# Patient Record
Sex: Male | Born: 1945
Health system: Southern US, Community
[De-identification: ages and names within clinical notes are randomized; demographics above are authoritative.]

## PROBLEM LIST (undated history)

## (undated) DIAGNOSIS — N529 Male erectile dysfunction, unspecified: Secondary | ICD-10-CM

## (undated) DIAGNOSIS — Z952 Presence of prosthetic heart valve: Secondary | ICD-10-CM

## (undated) DIAGNOSIS — I219 Acute myocardial infarction, unspecified: Secondary | ICD-10-CM

## (undated) DIAGNOSIS — N400 Enlarged prostate without lower urinary tract symptoms: Secondary | ICD-10-CM

## (undated) DIAGNOSIS — R339 Retention of urine, unspecified: Secondary | ICD-10-CM

## (undated) DIAGNOSIS — Z720 Tobacco use: Secondary | ICD-10-CM

## (undated) DIAGNOSIS — I213 ST elevation (STEMI) myocardial infarction of unspecified site: Secondary | ICD-10-CM

## (undated) DIAGNOSIS — Z789 Other specified health status: Secondary | ICD-10-CM

## (undated) DIAGNOSIS — R972 Elevated prostate specific antigen [PSA]: Secondary | ICD-10-CM

## (undated) DIAGNOSIS — I350 Nonrheumatic aortic (valve) stenosis: Secondary | ICD-10-CM

## (undated) DIAGNOSIS — E785 Hyperlipidemia, unspecified: Secondary | ICD-10-CM

## (undated) DIAGNOSIS — I2510 Atherosclerotic heart disease of native coronary artery without angina pectoris: Secondary | ICD-10-CM

## (undated) HISTORY — DX: Retention of urine, unspecified: R33.9

## (undated) HISTORY — DX: Male erectile dysfunction, unspecified: N52.9

## (undated) HISTORY — PX: TONSILLECTOMY: SUR1361

## (undated) HISTORY — DX: Tobacco use: Z72.0

## (undated) HISTORY — DX: Elevated prostate specific antigen (PSA): R97.20

## (undated) HISTORY — DX: Nonrheumatic aortic (valve) stenosis: I35.0

## (undated) HISTORY — PX: VASECTOMY: SHX75

## (undated) HISTORY — DX: Acute myocardial infarction, unspecified: I21.9

## (undated) HISTORY — PX: ANKLE FRACTURE SURGERY: SHX122

## (undated) HISTORY — PX: OTHER SURGICAL HISTORY: SHX169

---

## 2010-10-16 ENCOUNTER — Encounter: Payer: Self-pay | Admitting: Cardiovascular Disease

## 2015-03-02 ENCOUNTER — Emergency Department
Admission: EM | Admit: 2015-03-02 | Discharge: 2015-03-02 | Disposition: A | Discharge status: Home / Self Care | Payer: Medicare Other | Attending: Student | Admitting: Student

## 2015-03-02 ENCOUNTER — Encounter: Payer: Self-pay | Admitting: Emergency Medicine

## 2015-03-02 DIAGNOSIS — K4041 Unilateral inguinal hernia, with gangrene, recurrent: Secondary | ICD-10-CM | POA: Insufficient documentation

## 2015-03-02 DIAGNOSIS — Z720 Tobacco use: Secondary | ICD-10-CM | POA: Insufficient documentation

## 2015-03-02 DIAGNOSIS — R1030 Lower abdominal pain, unspecified: Secondary | ICD-10-CM | POA: Diagnosis present

## 2015-03-02 HISTORY — DX: Benign prostatic hyperplasia without lower urinary tract symptoms: N40.0

## 2015-03-02 NOTE — Discharge Instructions (Signed)
Hernia °A hernia happens when an organ inside your body pushes out through a weak spot in your belly (abdominal) wall. Most hernias get worse over time. They can often be pushed back into place (reduced). Surgery may be needed to repair hernias that cannot be pushed into place. °HOME CARE °· Keep doing normal activities. °· Avoid lifting more than 10 pounds (4.5 kilograms). °· Cough gently and avoid straining. Over time, these things will: °¨ Increase your hernia size. °¨ Irritate your hernia. °¨ Break down hernia repairs. °· Stop smoking. °· Do not wear anything tight over your hernia. Do not keep the hernia in with an outside bandage. °· Eat food that is high in fiber (fruit, vegetables, whole grains). °· Drink enough fluids to keep your pee (urine) clear or pale yellow. °· Take medicines to make your poop soft (stool softeners) if you cannot poop (constipated). °GET HELP RIGHT AWAY IF:  °· You have a fever. °· You have belly pain that gets worse. °· You feel sick to your stomach (nauseous) and throw up (vomit). °· Your skin starts to bulge out. °· Your hernia turns a different color, feels hard, or is tender. °· You have increased pain or puffiness (swelling) around the hernia. °· You poop more or less often. °· Your poop does not look the way normally does. °· You have watery poop (diarrhea). °· You cannot push the hernia back in place by applying gentle pressure while lying down. °MAKE SURE YOU:  °· Understand these instructions. °· Will watch your condition. °· Will get help right away if you are not doing well or get worse. °Document Released: 04/07/2010 Document Revised: 01/10/2012 Document Reviewed: 04/07/2010 °ExitCare® Patient Information ©2015 [employer]. This information is not intended to replace advice given to you by your health care provider. Make sure you discuss any questions you have with your health care provider. ° °

## 2015-03-02 NOTE — ED Notes (Signed)
Pt to ed for evaluation of groin pain. States " i have BPH and I have been out of my medicine since i moved"

## 2015-03-02 NOTE — ED Notes (Signed)
Pt states that today he started having left groin pain with a small lump noted, pt also states that he was having a burning pain in his penis but states that the pain as well as the lump is starting to go away

## 2015-03-02 NOTE — ED Provider Notes (Signed)
[facility] Emergency Department Provider Note    ____________________________________________  Time seen: 4034 hrs.  I have reviewed the triage vital signs and the nursing notes.   HISTORY  Chief Complaint Groin Pain   This 69 year old Caucasian male complaining of a left inguinal pain.    HPI Michael Edwards is a 69 y.o. male patient complained in the ingrown pain MS which is resolved while waiting in the emergency room location is in the left inguinal area.The onset of the pain and the mass approximately 3 hours ago patient now rates the pain as 0 although initial pain was 5. Patient described the pain as aching. He stated he knows the pain increases with, from a sitting to a standing position. Patient states does not know what made the pain get better but the masses also resolved patient states there is some urinary hesitancy but he contribute  that to his condition of BPH.   Past Medical History  Diagnosis Date  . Enlarged prostate     There are no active problems to display for this patient.   Past Surgical History  Procedure Laterality Date  . Blood clot removed from left upper leg    . Vasectomy      No current outpatient prescriptions on file.  Allergies Review of patient's allergies indicates no known allergies.  Family History  Problem Relation Age of Onset  . Diabetes Sister   . Heart failure Sister     Social History History  Substance Use Topics  . Smoking status: Current Every Day Smoker -- 1.50 packs/day  . Smokeless tobacco: Not on file  . Alcohol Use: Yes     Comment: 6 beers a week    Review of Systems Constitutional: Negative for fever. Eyes: Negative for visual changes. ENT: Negative for sore throat. Cardiovascular: Negative for chest pain. Respiratory: Negative for shortness of breath. Gastrointestinal: Negative for abdominal pain, vomiting and diarrhea. Genitourinary: Negative for dysuria. Positive for  urinary hesitancy Musculoskeletal: Negative for back pain. Skin: Negative for rash. Neurological: Negative for headaches, focal weakness or numbness. Psychiatric:None Endocrine:None Hematological/Lymphatic: Allergic/Immunilogical: **}  10-point ROS otherwise negative.  ____________________________________________   PHYSICAL EXAM:  VITAL SIGNS: ED Triage Vitals  Enc Vitals Group     BP 03/02/15 1553 143/75 mmHg     Pulse Rate 03/02/15 1553 71     Resp --      Temp 03/02/15 1553 98.2 F (36.8 C)     Temp Source 03/02/15 1553 Oral     SpO2 03/02/15 1553 98 %     Weight 03/02/15 1553 145 lb (65.772 kg)     Height 03/02/15 1553 5\' 5"  (1.651 m)     Head Cir --      Peak Flow --      Pain Score 03/02/15 1607 2     Pain Loc --      Pain Edu? --      Excl. in [place]? --      Constitutional: Alert and oriented. Well appearing and in no distress. Eyes: Conjunctivae are normal. PERRL. Normal extraocular movements. ENT   Head: Normocephalic and atraumatic.   Nose: No congestion/rhinnorhea.   Mouth/Throat: Mucous membranes are moist.   Neck: No stridor. Hematological/Lymphatic/Immunilogical: No cervical lymphadenopathy. Cardiovascular: Normal rate, regular rhythm. Normal and symmetric distal pulses are present in all extremities. No murmurs, rubs, or gallops. Respiratory: Normal respiratory effort without tachypnea nor retractions. Breath sounds are clear and equal bilaterally. No wheezes/rales/rhonchi. Gastrointestinal: Soft and nontender.  No distention. No abdominal bruits. There is no CVA tenderness. Genitourinary: No palpable left inguinal mass. Patient. Prostate exam. Musculoskeletal: Nontender with normal range of motion in all extremities. No joint effusions.  No lower extremity tenderness nor edema. Neurologic:  Normal speech and language. No gross focal neurologic deficits are appreciated. Speech is normal. No gait instability. Skin:  Skin is warm, dry and  intact. No rash noted. Psychiatric: Mood and affect are normal. Speech and behavior are normal. Patient exhibits appropriate insight and judgment.  ____________________________________________   EKG    ____________________________________________    RADIOLOGY    ____________________________________________   PROCEDURES  Procedure(s) performed: None  Critical Care performed: No  ____________________________________________   INITIAL IMPRESSION / ASSESSMENT AND PLAN / ED COURSE  Pertinent labs & imaging results that were available during my care of the patient were reviewed by me and considered in my medical decision making (see chart for details).    ____________________________________________   FINAL CLINICAL IMPRESSION(S) / ED DIAGNOSES  Final diagnoses:  Unilateral recurrent inguinal hernia with gangrene    Sable Feil, PA-C 03/02/15 [place], PA-C 03/02/15 1719  Joanne Gavel, MD 03/03/15 [phone]

## 2015-07-14 LAB — PSA: PSA: 10.6

## 2015-08-19 ENCOUNTER — Encounter: Payer: Self-pay | Admitting: *Deleted

## 2015-08-19 DIAGNOSIS — I219 Acute myocardial infarction, unspecified: Secondary | ICD-10-CM

## 2015-08-19 DIAGNOSIS — I350 Nonrheumatic aortic (valve) stenosis: Secondary | ICD-10-CM

## 2015-08-19 DIAGNOSIS — R011 Cardiac murmur, unspecified: Secondary | ICD-10-CM

## 2015-08-19 HISTORY — DX: Acute myocardial infarction, unspecified: I21.9

## 2015-08-19 HISTORY — DX: Cardiac murmur, unspecified: R01.1

## 2015-08-19 HISTORY — DX: Nonrheumatic aortic (valve) stenosis: I35.0

## 2015-08-20 ENCOUNTER — Ambulatory Visit: Payer: Self-pay

## 2015-08-20 ENCOUNTER — Ambulatory Visit (INDEPENDENT_AMBULATORY_CARE_PROVIDER_SITE_OTHER): Payer: Medicare Other | Admitting: Obstetrics and Gynecology

## 2015-08-20 ENCOUNTER — Encounter: Payer: Self-pay | Admitting: Obstetrics and Gynecology

## 2015-08-20 VITALS — BP 118/72 | HR 64 | Resp 16 | Ht 65.0 in | Wt 145.0 lb

## 2015-08-20 DIAGNOSIS — N400 Enlarged prostate without lower urinary tract symptoms: Secondary | ICD-10-CM | POA: Diagnosis not present

## 2015-08-20 DIAGNOSIS — N509 Disorder of male genital organs, unspecified: Secondary | ICD-10-CM | POA: Diagnosis not present

## 2015-08-20 DIAGNOSIS — R972 Elevated prostate specific antigen [PSA]: Secondary | ICD-10-CM | POA: Diagnosis not present

## 2015-08-20 DIAGNOSIS — N5089 Other specified disorders of the male genital organs: Secondary | ICD-10-CM

## 2015-08-20 LAB — BLADDER SCAN AMB NON-IMAGING

## 2015-08-20 NOTE — Patient Instructions (Signed)
Transrectal Ultrasound-Guided Biopsy A transrectal ultrasound-guided biopsy is a procedure to remove samples of tissue from your prostate using ultrasound images to guide the procedure. The procedure is usually done to evaluate the prostate gland of men who have an elevated prostate-specific antigen (PSA). PSA is a blood test to screen for prostate cancer. The biopsy samples are taken to check for prostate cancer.  LET [facility] CARE PROVIDER KNOW ABOUT:  Any allergies you have.  All medicines you are taking, including vitamins, herbs, eye drops, creams, and over-the-counter medicines.  Previous problems you or members of your family have had with the use of anesthetics.  Any blood disorders you have.  Previous surgeries you have had.  Medical conditions you have. RISKS AND COMPLICATIONS Generally, this is a safe procedure. However, as with any procedure, problems can occur. Possible problems include:  Infection of your prostate.  Bleeding from your rectum or blood in your urine.  Difficulty urinating.  Nerve damage (this is usually temporary).  Damage to surrounding structures such as blood vessels, organs, and muscles, which would require other procedures. BEFORE THE PROCEDURE  Do not eat or drink anything after midnight on the night before the procedure or as directed by your health care provider.  Take medicines only as directed by your health care provider.  Your health care provider may have you stop taking certain medicines 5-7 days before the procedure.  You will be given an enema before the procedure. During an enema, a liquid is injected into your rectum to clear out waste.  You may have lab tests the day of your procedure.   Plan to have someone take you home after the procedure. PROCEDURE   You will be given medicine to help you relax (sedative) before the procedure. An IV tube will be inserted into one of your veins and used to give fluids and  medicine.  You will be given antibiotic medicine to reduce the risk of an infection.  You will be placed on your side for the procedure.  A probe with lubricated gel will be placed into your rectum, and images will be taken of your prostate and surrounding structures.  Numbing medicine will be injected into the prostate before the biopsy samples are taken.  A biopsy needle will then be inserted and guided to your prostate with the use of the ultrasound images.  Samples of prostate tissue will be taken, and the needle will then be removed.  The biopsy samples will be sent to a lab to be analyzed. Results are usually back in 2-3 days. AFTER THE PROCEDURE  You will be taken to a recovery area where you will be monitored.  You may have some discomfort in the rectal area. You will be given pain medicines to control this.  You may be allowed to go home the same day, or you may need to stay in the hospital overnight.   This information is not intended to replace advice given to you by your health care provider. Make sure you discuss any questions you have with your health care provider.   Document Released: 03/04/2014 Document Revised: 11/08/2014 Document Reviewed: 03/04/2014 Elsevier Interactive Patient Education [employer].

## 2015-08-20 NOTE — Progress Notes (Signed)
08/20/2015 12:54 PM   [place] 1946-10-22 [id number]  Referring provider: [facility] [place] [address] [place], [place] [address]  Chief Complaint  Patient presents with  . Benign Prostatic Hypertrophy  . Establish Care    HPI: Patient is a 69 year old male with a history of BPH presenting today as a referral for elevated PSA. He recently moved to the area and was seen at the [facility] to establish care. At that time he was complaining of significant lower urinary tract symptoms. He reportedly had been taking Cialis 20 mg as needed for her urinary complaints of erectile issues but has not taken it for some time due to cost. His primary care provider prescribed him Flomax which he reports has significantly improved his urinary symptoms. His PSA was drawn on that time and noted to be 10.9.  Symptoms prior to Flomax initiation included hesitancy, difficulty voiding and intermittent stream.   Family history of prostate cancer unknown- adopted.  I-PSS- 6  QOL- pleased 07/14/15 PSA 10.6 (no previously documented levels) PVR- 231mL  PMH: Past Medical History  Diagnosis Date  . Enlarged prostate   . Elevated PSA     10.6 on 07/2015  . ED (erectile dysfunction)   . Left shoulder pain   . MI (myocardial infarction) [facility])     Surgical History: Past Surgical History  Procedure Laterality Date  . Blood clot removed from left upper leg    . Vasectomy      Home Medications:    Medication List       This list is accurate as of: 08/20/15 12:54 PM.  Always use your most recent med list.               HYDROCHLOROTHIAZIDE PO  Take 0.4 capsules by mouth daily.     sildenafil 50 MG tablet  Commonly known as:  VIAGRA  Take by mouth.     tamsulosin 0.4 MG Caps capsule  Commonly known as:  FLOMAX        Allergies: No Known Allergies  Family History: Family History  Problem Relation Age of Onset  . Diabetes Sister   . Heart failure Sister   .  Hematuria Mother     Social History:  reports that he has been smoking.  He does not have any smokeless tobacco history on file. He reports that he drinks alcohol. He reports that he does not use illicit drugs.  ROS: UROLOGY Frequent Urination?: Yes Hard to postpone urination?: Yes Burning/pain with urination?: Yes Get up at night to urinate?: Yes Leakage of urine?: Yes Urine stream starts and stops?: Yes Trouble starting stream?: Yes Do you have to strain to urinate?: No Blood in urine?: No Urinary tract infection?: No Sexually transmitted disease?: No Injury to kidneys or bladder?: No Painful intercourse?: No Weak stream?: Yes Erection problems?: Yes Penile pain?: Yes  Gastrointestinal Nausea?: No Vomiting?: No Indigestion/heartburn?: No Diarrhea?: No Constipation?: No  Constitutional Fever: No Night sweats?: No Weight loss?: No Fatigue?: No  Skin Skin rash/lesions?: No Itching?: No  Eyes Blurred vision?: No Double vision?: No  Ears/Nose/Throat Sore throat?: No Sinus problems?: Yes  Hematologic/Lymphatic Swollen glands?: No Easy bruising?: No  Cardiovascular Leg swelling?: No Chest pain?: No  Respiratory Cough?: No Shortness of breath?: No  Endocrine Excessive thirst?: No  Musculoskeletal Back pain?: Yes Joint pain?: Yes  Neurological Headaches?: No Dizziness?: Yes  Psychologic Depression?: No Anxiety?: No  Physical Exam: BP 118/72 mmHg  Pulse 64  Resp 16  Ht 5\' 5"  (1.651 m)  Wt 145 lb (65.772 kg)  BMI 24.13 kg/m2  Constitutional:  Alert and oriented, No acute distress. HEENT: [place] AT, moist mucus membranes.  Trachea midline, no masses. Cardiovascular: No clubbing, cyanosis, or edema. Respiratory: Normal respiratory effort, no increased work of breathing. GI: Abdomen is soft, nontender, nondistended, no abdominal masses GU: No CVA tenderness.  Right-sided scrotal mass, non tender, DRE: prostate +3, smooth, without  nodules Skin: No rashes, bruises or suspicious lesions. Lymph: No cervical or inguinal adenopathy. Neurologic: Grossly intact, no focal deficits, moving all 4 extremities. Psychiatric: Normal mood and affect.  Laboratory Data:   Urinalysis No results found for: COLORURINE, APPEARANCEUR, LABSPEC, PHURINE, GLUCOSEU, HGBUR, BILIRUBINUR, KETONESUR, PROTEINUR, UROBILINOGEN, NITRITE, LEUKOCYTESUR  Pertinent Imaging:   Assessment & Plan:   1. BPH (benign prostatic hyperplasia) PVR 237mL today. Patient denies any suprapubic discomfort. Patient reports his symptoms are much improved since starting Flomax prescribed by his PCP.  Elevated PVR may be due to recent significant urinary retention. We will allow some more time for bladder decompression and recheck his PVR in 2 weeks. I instructed patient to begin double voiding in the morning and in the evenings prior to bed. - PSA  2. Elevated PSA-  patient with an elevated PSA of 10.9 on routine screening. He does have a significant history of BPH with LUTS. He reports doing well previously on Cialis but he is unable to afford the medication. He was started on Flomax by his primary care provider. I suspect patient's elevated PSA is likely due to his enlarged prostate in combination with urinary retention. PSA rechecked today. Possible prostate biopsy pending PSA results. We reviewed the implications of an elevated PSA and the uncertainty surrounding it. In general, a man's PSA increases with age and is produced by both normal and cancerous prostate tissue. Differential for elevated PSA is BPH, prostate cancer, infection, recent intercourse/ejaculation, prostate infarction, recent urethroscopic manipulation (foley placement/cystoscopy) and prostatitis. Management of an elevated PSA can include observation or prostate biopsy and we discussed this in detail. We discussed that indications for prostate biopsy are defined by age and race specific PSA cutoffs as  well as a PSA velocity of 0.75/year. We discussed prostate biopsy in detail including the procedure itself, the risks of blood in the urine, stool, and ejaculate, serious infection, and discomfort. He is willing to proceed with this as discussed.  2. Scrotal Mass- Right sided scrotal mass palpated on exam.  Possible epididymal cyst vs testicular mass. RUS ordered for further evaluation.  Return in about 6 months (around 02/18/2016).  These notes generated with voice recognition software. I apologize for typographical errors.  Herbert Moors, [facility] [address] [place], [place] [address] [phone]

## 2015-08-21 LAB — PSA: Prostate Specific Ag, Serum: 8.4 ng/mL — ABNORMAL HIGH (ref 0.0–4.0)

## 2015-08-25 ENCOUNTER — Telehealth: Payer: Self-pay | Admitting: Obstetrics and Gynecology

## 2015-08-25 MED ORDER — FINASTERIDE 5 MG PO TABS: 5.0000 mg | ORAL_TABLET | Freq: Every day | ORAL | Status: DC

## 2015-08-25 NOTE — Telephone Encounter (Signed)
I spent with this patient regarding his PSA levels. It has decreased from 10.9 to 8.4 over the last month.  He understands that a PSA of 8.4 is still concerning for prostate cancer however his PVR was significantly elevated at last visit and could be affecting his PSA.   Patient would not like to proceed with biopsy at this time but we will start Finasteride and recheck PSA in 3 months.

## 2015-08-29 ENCOUNTER — Ambulatory Visit
Admission: RE | Admit: 2015-08-29 | Discharge: 2015-08-29 | Disposition: A | Discharge status: Home / Self Care | Payer: Medicare Other | Source: Ambulatory Visit | Attending: Obstetrics and Gynecology | Admitting: Obstetrics and Gynecology

## 2015-08-29 DIAGNOSIS — N5089 Other specified disorders of the male genital organs: Secondary | ICD-10-CM

## 2015-08-29 DIAGNOSIS — N509 Disorder of male genital organs, unspecified: Secondary | ICD-10-CM | POA: Diagnosis not present

## 2015-09-03 ENCOUNTER — Ambulatory Visit: Payer: Medicare Other

## 2015-09-04 ENCOUNTER — Ambulatory Visit (INDEPENDENT_AMBULATORY_CARE_PROVIDER_SITE_OTHER): Payer: Medicare Other | Admitting: Obstetrics and Gynecology

## 2015-09-04 ENCOUNTER — Encounter: Payer: Self-pay | Admitting: Obstetrics and Gynecology

## 2015-09-04 VITALS — BP 102/64 | HR 66 | Resp 16 | Ht 65.0 in | Wt 146.0 lb

## 2015-09-04 DIAGNOSIS — N400 Enlarged prostate without lower urinary tract symptoms: Secondary | ICD-10-CM | POA: Diagnosis not present

## 2015-09-04 LAB — MICROSCOPIC EXAMINATION
BACTERIA UA: NONE SEEN
EPITHELIAL CELLS (NON RENAL): NONE SEEN /HPF (ref 0–10)
RBC, UA: NONE SEEN /hpf (ref 0–?)
RENAL EPITHEL UA: NONE SEEN /HPF
WBC, UA: NONE SEEN /hpf (ref 0–?)

## 2015-09-04 LAB — URINALYSIS, COMPLETE
Bilirubin, UA: NEGATIVE
Glucose, UA: NEGATIVE
Ketones, UA: NEGATIVE
LEUKOCYTES UA: NEGATIVE
Nitrite, UA: NEGATIVE
PH UA: 5.5 (ref 5.0–7.5)
PROTEIN UA: NEGATIVE
Specific Gravity, UA: 1.02 (ref 1.005–1.030)
Urobilinogen, Ur: 0.2 mg/dL (ref 0.2–1.0)

## 2015-09-04 LAB — BLADDER SCAN AMB NON-IMAGING: Scan Result: 206

## 2015-09-04 NOTE — Progress Notes (Signed)
09/04/2015 10:27 AM   Michael Edwards Michael Edwards [birth year] [id number]  Referring provider: [facility] [place], [place] [address]  Chief Complaint  Patient presents with  . Benign Prostatic Hypertrophy  . Follow-up    HPI: Patient presents for f/u for elevated PSA, BPH with LUTS and RUS results performed for further evaluation of scrotal mass palpated on exam. Recheck PSA decreased from 10.9 to 8.4 and patient was started on Finasteride. He was notified of results on the telephone and declined biopsy.  IMPRESSION: Normal sonographic appearance of the bilateral testes. No evidence of testicular torsion. 3.2 x 1.9 x 1.8 cm right epididymal cyst versus spermatocele.  Previous History: Patient is a 69 year old male with a history of BPH presenting today as a referral for elevated PSA. He recently moved to the area and was seen at the [facility] to establish care. At that time he was complaining of significant lower urinary tract symptoms. He reportedly had been taking Cialis 20 mg as needed for her urinary complaints of erectile issues but has not taken it for some time due to cost. His primary care provider prescribed him Flomax which he reports has significantly improved his urinary symptoms. His PSA was drawn on that time and noted to be 10.9.  Symptoms prior to Flomax initiation included hesitancy, difficulty voiding and intermittent stream.   Family history of prostate cancer unknown- adopted.  I-PSS- 6 QOL- pleased 07/14/15 PSA 10.6 (no previously documented levels) PVR- 240mL    PMH: Past Medical History  Diagnosis Date  . Enlarged prostate   . Elevated PSA     10.6 on 07/2015  . ED (erectile dysfunction)   . Left shoulder pain   . MI (myocardial infarction) [facility])     Surgical History: Past Surgical History  Procedure Laterality Date  . Blood clot removed from left upper leg    . Vasectomy      Home Medications:    Medication List       This list is accurate as of: 09/04/15 10:27 AM.  Always use your most recent med list.               finasteride 5 MG tablet  Commonly known as:  PROSCAR  Take 1 tablet (5 mg total) by mouth daily.     HYDROCHLOROTHIAZIDE PO  Take 0.4 capsules by mouth daily.     sildenafil 50 MG tablet  Commonly known as:  VIAGRA  Take by mouth.     tamsulosin 0.4 MG Caps capsule  Commonly known as:  FLOMAX        Allergies: No Known Allergies  Family History: Family History  Problem Relation Age of Onset  . Diabetes Sister   . Heart failure Sister   . Hematuria Mother     Social History:  reports that he has been smoking.  He does not have any smokeless tobacco history on file. He reports that he drinks alcohol. He reports that he does not use illicit drugs.  ROS: UROLOGY Frequent Urination?: Yes Hard to postpone urination?: No Burning/pain with urination?: No Get up at night to urinate?: No Leakage of urine?: No Urine stream starts and stops?: No Trouble starting stream?: No Do you have to strain to urinate?: No Blood in urine?: No Urinary tract infection?: No Sexually transmitted disease?: No Injury to kidneys or bladder?: No Painful intercourse?: No Weak stream?: Yes Erection problems?: No Penile pain?: No  Gastrointestinal Nausea?: No Vomiting?: No Indigestion/heartburn?: No  Diarrhea?: No Constipation?: No  Constitutional Fever: No Night sweats?: No Weight loss?: No Fatigue?: No  Skin Skin rash/lesions?: No Itching?: No  Eyes Blurred vision?: No Double vision?: No  Ears/Nose/Throat Sore throat?: No Sinus problems?: No  Hematologic/Lymphatic Swollen glands?: No Easy bruising?: No  Cardiovascular Leg swelling?: No Chest pain?: No  Respiratory Cough?: No Shortness of breath?: No  Endocrine Excessive thirst?: No  Musculoskeletal Back pain?: No Joint pain?: No  Neurological Headaches?: No Dizziness?: No  Psychologic Depression?:  No Anxiety?: No  Physical Exam: BP 102/64 mmHg  Pulse 66  Resp 16  Ht 5\' 5"  (1.651 m)  Wt 146 lb (66.225 kg)  BMI 24.30 kg/m2  Constitutional:  Alert and oriented, No acute distress. HEENT: [place] AT, moist mucus membranes.  Trachea midline, no masses. Cardiovascular: No clubbing, cyanosis, or edema. Respiratory: Normal respiratory effort, no increased work of breathing. Skin: No rashes, bruises or suspicious lesions. Lymph: No cervical adenopathy. Neurologic: Grossly intact, no focal deficits, moving all 4 extremities. Psychiatric: Normal mood and affect.  Laboratory Data:   Urinalysis No results found for: COLORURINE, APPEARANCEUR, LABSPEC, PHURINE, GLUCOSEU, HGBUR, BILIRUBINUR, KETONESUR, PROTEINUR, UROBILINOGEN, NITRITE, LEUKOCYTESUR  Pertinent Imaging: EXAM: ULTRASOUND OF SCROTUM  TECHNIQUE: Complete ultrasound examination of the testicles, epididymis, and other scrotal structures was performed.  COMPARISON: None.  FINDINGS: Right testicle  Measurements: 4.1 x 1.9 x 2.8 cm. 3 x 2 x 3 mm intratesticular cyst, benign.  Left testicle  Measurements: 4.4 x 2.3 x 2.3 cm. No mass or microlithiasis visualized.  Right epididymis: 3.2 x 1.9 x 1.8 cm mildly complex epididymal cyst with septations.  Left epididymis: 2 x 2 x 2 mm epididymal cyst.  Hydrocele: Small right hydrocele.  Varicocele: None visualized.  IMPRESSION: Normal sonographic appearance of the bilateral testes. No evidence of testicular torsion. 3.2 x 1.9 x 1.8 cm right epididymal cyst versus spermatocele. Electronically Signed  By: Julian Hy M.D.  On: 08/29/2015 16:18  Assessment & Plan:    1. Scrotal mass- Benign findings on RUS. Results reviewed with patient and wife. IMPRESSION: Normal sonographic appearance of the bilateral testes. No evidence of testicular torsion. 3.2 x 1.9 x 1.8 cm right epididymal cyst versus spermatocele.  2. BPH (benign prostatic  hyperplasia)- PVR 268mL today improved since last visit. Patient denies any suprapubic discomfort. Patient reports his symptoms are much improved since starting Flomax prescribed by his PCP. Elevated PVR may be due to recent significant urinary retention.  I instructed patient to begin double voiding in the morning and in the evenings prior to bed.  He has not yet picked up his prescription for Finasteride. - Urinalysis, Complete - BLADDER SCAN AMB NON-IMAGING  3. Elevated PSA-  I further reviewed patient's risk analysis with him and his wife. He understands that given his age and PSA score he has 14% chance of high grade prostate cancer and a 23% chance of low grade prostate cancer.  Patient continues to decline a prostate biopsy at this time.   Return in about 3 months (around 12/05/2015) for recheck PSA/PVR.  These notes generated with voice recognition software. I apologize for typographical errors.  Herbert Moors, [facility] [address] [place], [place] [address] [phone]

## 2015-11-06 ENCOUNTER — Encounter: Payer: Self-pay | Admitting: Obstetrics and Gynecology

## 2015-11-06 ENCOUNTER — Ambulatory Visit (INDEPENDENT_AMBULATORY_CARE_PROVIDER_SITE_OTHER): Payer: Medicare Other | Admitting: Obstetrics and Gynecology

## 2015-11-06 VITALS — BP 138/78 | HR 84 | Resp 16 | Ht 65.0 in | Wt 148.3 lb

## 2015-11-06 DIAGNOSIS — R339 Retention of urine, unspecified: Secondary | ICD-10-CM

## 2015-11-06 DIAGNOSIS — N400 Enlarged prostate without lower urinary tract symptoms: Secondary | ICD-10-CM

## 2015-11-06 DIAGNOSIS — R972 Elevated prostate specific antigen [PSA]: Secondary | ICD-10-CM

## 2015-11-06 DIAGNOSIS — N529 Male erectile dysfunction, unspecified: Secondary | ICD-10-CM

## 2015-11-06 LAB — URINALYSIS, COMPLETE
BILIRUBIN UA: NEGATIVE
Glucose, UA: NEGATIVE
KETONES UA: NEGATIVE
LEUKOCYTES UA: NEGATIVE
Nitrite, UA: NEGATIVE
PH UA: 6.5 (ref 5.0–7.5)
PROTEIN UA: NEGATIVE
RBC UA: NEGATIVE
Urobilinogen, Ur: 0.2 mg/dL (ref 0.2–1.0)

## 2015-11-06 LAB — MICROSCOPIC EXAMINATION
BACTERIA UA: NONE SEEN
EPITHELIAL CELLS (NON RENAL): NONE SEEN /HPF (ref 0–10)
RBC MICROSCOPIC, UA: NONE SEEN /HPF (ref 0–?)

## 2015-11-06 LAB — BLADDER SCAN AMB NON-IMAGING: Scan Result: 440

## 2015-11-06 MED ORDER — TADALAFIL 20 MG PO TABS: 20.0000 mg | ORAL_TABLET | Freq: Every day | ORAL | Status: DC | PRN

## 2015-11-06 NOTE — Progress Notes (Signed)
11:11 AM   Michael Edwards Michael Edwards 08-30-1946 [id number]  Referring provider: [facility]. [place], [place] [address]  Chief Complaint  Patient presents with  . Medication Management  . Benign Prostatic Hypertrophy    HPI: Patient is a 70 year old male with a history of BPH presenting today as a referral for elevated PSA. He recently moved to the area and was seen at the [facility] to establish care. At that time he was complaining of significant lower urinary tract symptoms. He reportedly had been taking Cialis 20 mg as needed for her urinary complaints of erectile issues but has not taken it for some time due to cost. His primary care provider prescribed him Flomax which he reports has significantly improved his urinary symptoms. His PSA was drawn on that time and noted to be 10.9. PSA was repeated on 08/20/15 and remained elevated at 8.4.  Prostate biopsy was recommended and patient declined.  Symptoms prior to Flomax initiation included hesitancy, difficulty voiding and intermittent stream.   Family history of prostate cancer unknown- adopted.  I-PSS- 6 QOL- pleased 07/14/15 PSA 10.6 (no previously documented levels) PVR- 21mL   Current Status: Presents today to discuss his current medications. He reports that he is concerned that he is not able to take his Flomax at the same time everyday because he does not eat the same time every day. He reports that his urinary symptoms are unchanged. He denies any sensation of incomplete bladder emptying or abdominal pain. He states that he picked up a few pills of Viagra and that he was not satisfied with the results. He reports better results with Cialis and his recent question a prescription today.  PMH: Past Medical History  Diagnosis Date  . Enlarged prostate   . Elevated PSA     10.6 on 07/2015  . ED (erectile dysfunction)   . Left shoulder pain   . MI (myocardial infarction) [facility])      Surgical History: Past Surgical History  Procedure Laterality Date  . Blood clot removed from left upper leg    . Vasectomy      Home Medications:    Medication List       This list is accurate as of: 11/06/15 11:11 AM.  Always use your most recent med list.               finasteride 5 MG tablet  Commonly known as:  PROSCAR  Take 1 tablet (5 mg total) by mouth daily.     multivitamin tablet  Take 1 tablet by mouth daily.     sildenafil 50 MG tablet  Commonly known as:  VIAGRA  Take by mouth. Reported on 11/06/2015     tamsulosin 0.4 MG Caps capsule  Commonly known as:  FLOMAX        Allergies: No Known Allergies  Family History: Family History  Problem Relation Age of Onset  . Diabetes Sister   . Heart failure Sister   . Hematuria Mother     Social History:  reports that he has been smoking.  He does not have any smokeless tobacco history on file. He reports that he drinks alcohol. He reports that he does not use illicit drugs.  ROS: UROLOGY Frequent Urination?: No Hard to postpone urination?: No Burning/pain with urination?: No Get up at night to urinate?: No Leakage of urine?: No Urine stream starts and stops?: No Trouble starting stream?: No Do you have to strain  to urinate?: No Blood in urine?: No Urinary tract infection?: No Sexually transmitted disease?: No Injury to kidneys or bladder?: No Painful intercourse?: No Weak stream?: No Erection problems?: No Penile pain?: No  Gastrointestinal Nausea?: No Vomiting?: No Indigestion/heartburn?: No Diarrhea?: No Constipation?: No  Constitutional Fever: No Night sweats?: No Weight loss?: No Fatigue?: No  Skin Skin rash/lesions?: No Itching?: No  Eyes Blurred vision?: No Double vision?: No  Ears/Nose/Throat Sore throat?: No Sinus problems?: No  Hematologic/Lymphatic Swollen glands?: No Easy bruising?: No  Cardiovascular Leg swelling?: No Chest pain?:  No  Respiratory Cough?: No Shortness of breath?: No  Endocrine Excessive thirst?: No  Musculoskeletal Back pain?: No Joint pain?: No  Neurological Headaches?: No Dizziness?: No  Psychologic Depression?: No Anxiety?: No  Physical Exam: BP 138/78 mmHg  Pulse 84  Resp 16  Ht 5\' 5"  (1.651 m)  Wt 148 lb 4.8 oz (67.268 kg)  BMI 24.68 kg/m2  Constitutional:  Alert and oriented, No acute distress. HEENT: [place] AT, moist mucus membranes.  Trachea midline, no masses. Cardiovascular: No clubbing, cyanosis, or edema. Respiratory: Normal respiratory effort, no increased work of breathing. Skin: No rashes, bruises or suspicious lesions. Lymph: No cervical adenopathy. Neurologic: Grossly intact, no focal deficits, moving all 4 extremities. Psychiatric: Normal mood and affect.  Laboratory Data:   Urinalysis Results for orders placed or performed in visit on 11/06/15  Microscopic Examination  Result Value Ref Range   WBC, UA 0-5 0 -  5 /hpf   RBC, UA None seen 0 -  2 /hpf   Epithelial Cells (non renal) None seen 0 - 10 /hpf   Mucus, UA Present (A) Not Estab.   Bacteria, UA None seen None seen/Few  Urinalysis, Complete  Result Value Ref Range   Specific Gravity, UA <1.005 (L) 1.005 - 1.030   pH, UA 6.5 5.0 - 7.5   Color, UA Yellow Yellow   Appearance Ur Clear Clear   Leukocytes, UA Negative Negative   Protein, UA Negative Negative/Trace   Glucose, UA Negative Negative   Ketones, UA Negative Negative   RBC, UA Negative Negative   Bilirubin, UA Negative Negative   Urobilinogen, Ur 0.2 0.2 - 1.0 mg/dL   Nitrite, UA Negative Negative   Microscopic Examination See below:   BLADDER SCAN AMB NON-IMAGING  Result Value Ref Range   Scan Result 440 mL      Pertinent Imaging:  Assessment & Plan:   1. Erectile dysfunction-  Patient reports he is unsatisfied with Viagra. Better results in the past with Cialis. I sent him prescription into his pharmacy today.  2. BPH (benign  prostatic hyperplasia)-  Patient denies any suprapubic discomfort. Patient denies any significant change in his urinary symptoms. He has been taking his finasteride and Flomax daily. His PVR is significantly elevated today at 440 mL's. He denies sensation of needing to void. I discussed his options including starting CIC once in the morning and once at night and/or Foley catheter replacement with repeat voiding trial. He was either of these interventions at this time. He understands that without intervention and could be permanent injury to his bladder and/or kidneys.  I also discussed the possibility of an outlet procedure in the future. He is open to a possible surgical procedure. I will have him scheduled for follow-up with a physician to discuss possible surgical options when he returns for a recheck on his PSA. -Continue Flomax. Patient advised that he should try to take his medication the same time every  day 30 minutes after meals though if he is unable to do this is sufficient to take the medicine daily with aware of how food. - Urinalysis, Complete - BLADDER SCAN AMB NON-IMAGING  3. Urinary retention-  As above   4. Elevated PSA-  Patient's risk analysis has been reviewed with patient and his wife.Marland Kitchen He understands that given his age and PSA score he has 14% chance of high grade prostate cancer and a 23% chance of low grade prostate cancer.  Patient continues to decline a prostate biopsy at this time.    Return for switch f/u visit to MD schedule to discuss elevated PSA and BPH .  These notes generated with voice recognition software. I apologize for typographical errors.  Herbert Moors, [facility] [address], [place], [place] [address] [phone]

## 2015-12-04 ENCOUNTER — Ambulatory Visit: Payer: Medicare Other | Admitting: Obstetrics and Gynecology

## 2015-12-11 ENCOUNTER — Ambulatory Visit (INDEPENDENT_AMBULATORY_CARE_PROVIDER_SITE_OTHER): Payer: Medicare Other | Admitting: Urology

## 2015-12-11 VITALS — BP 118/63 | HR 92 | Ht 64.0 in | Wt 147.7 lb

## 2015-12-11 DIAGNOSIS — N400 Enlarged prostate without lower urinary tract symptoms: Secondary | ICD-10-CM | POA: Diagnosis not present

## 2015-12-11 DIAGNOSIS — N529 Male erectile dysfunction, unspecified: Secondary | ICD-10-CM | POA: Diagnosis not present

## 2015-12-11 DIAGNOSIS — R972 Elevated prostate specific antigen [PSA]: Secondary | ICD-10-CM

## 2015-12-11 DIAGNOSIS — R339 Retention of urine, unspecified: Secondary | ICD-10-CM | POA: Diagnosis not present

## 2015-12-11 LAB — URINALYSIS, COMPLETE
Bilirubin, UA: NEGATIVE
GLUCOSE, UA: NEGATIVE
Ketones, UA: NEGATIVE
Leukocytes, UA: NEGATIVE
NITRITE UA: NEGATIVE
Protein, UA: NEGATIVE
Specific Gravity, UA: 1.01 (ref 1.005–1.030)
Urobilinogen, Ur: 0.2 mg/dL (ref 0.2–1.0)
pH, UA: 6 (ref 5.0–7.5)

## 2015-12-11 LAB — MICROSCOPIC EXAMINATION
Bacteria, UA: NONE SEEN
Epithelial Cells (non renal): NONE SEEN /hpf (ref 0–10)

## 2015-12-11 LAB — BLADDER SCAN AMB NON-IMAGING

## 2015-12-11 NOTE — Progress Notes (Addendum)
Continuous Intermittent Catheterization  Due to incomplete bladder emptying and BPH patient is present today for a teaching of self I & O Catheterization. Patient was given detailed verbal and printed instructions of self catheterization. Patient was cleaned and prepped in a sterile fashion.  With instruction and assistance patient attempted to insert a 14FR cath without success patient met resistance, then a 14FR coude was tried with success and urine return was noted 420 ml, urine was yellow in color. Patient tolerated well, no complications were noted Patient was given a sample bag with supplies to take home.  Instructions were given per Dr. Darnelle Going for patient to cath 2 times daily for at least 90 days and to record PVR volume for at least a week.  An order was placed with [facility] for catheters to be sent to the patient's home. Patient is to follow up with a prostate biopsy, instructions were given verbally and paper for patient to take home. Patient's wife was present at today's visit and was given instruction as well to help the patient if needed.   Preformed by: Fonnie Jarvis, CMA

## 2015-12-11 NOTE — Progress Notes (Addendum)
12/11/2015 11:55 AM   [name] 07/11/1946 [id number]  Referring provider: [facility]. [place], [place] [address]  Chief Complaint  Patient presents with  . Benign Prostatic Hypertrophy    HPI: Patient is a 70 year old male with a history of BPH presenting today as a referral for elevated PSA. He recently moved to the area and was seen at the [facility] to establish care. At that time he was complaining of significant lower urinary tract symptoms. He reportedly had been taking Cialis 20 mg as needed for her urinary complaints of erectile issues but has not taken it for some time due to cost. His primary care provider prescribed him Flomax which he reports has significantly improved his urinary symptoms. His PSA was drawn on that time and noted to be 10.9. PSA was repeated on 08/20/15 and remained elevated at 8.4. Prostate biopsy was recommended and patient declined.  At his last visit, his PVR was 440. Start how to perform CIC. He also was instructed to continue Flomax.  The patient returns today after being on Flomax and finasteride for approximately 6 weeks. He is very happy with his urinary function with an eye PSS score of 6/2. However, his PVR was him was 500 cc today. He feels like he is empty. He refused to do CIC as he was taught at his last visit.    PMH: Past Medical History  Diagnosis Date  . Enlarged prostate   . Elevated PSA     10.6 on 07/2015  . ED (erectile dysfunction)   . Left shoulder pain   . MI (myocardial infarction) [facility])     Surgical History: Past Surgical History  Procedure Laterality Date  . Blood clot removed from left upper leg    . Vasectomy      Home Medications:    Medication List       This list is accurate as of: 12/11/15 11:55 AM.  Always use your most recent med list.               finasteride 5 MG tablet  Commonly known as:  PROSCAR  Take 1 tablet (5 mg total) by  mouth daily.     multivitamin tablet  Take 1 tablet by mouth daily.     tadalafil 20 MG tablet  Commonly known as:  CIALIS  Take 1 tablet (20 mg total) by mouth daily as needed for erectile dysfunction.     tamsulosin 0.4 MG Caps capsule  Commonly known as:  FLOMAX        Allergies: No Known Allergies  Family History: Family History  Problem Relation Age of Onset  . Diabetes Sister   . Heart failure Sister   . Hematuria Mother     Social History:  reports that he has been smoking.  He does not have any smokeless tobacco history on file. He reports that he drinks alcohol. He reports that he does not use illicit drugs.  ROS: UROLOGY Frequent Urination?: No Hard to postpone urination?: No Burning/pain with urination?: No Get up at night to urinate?: No Leakage of urine?: No Urine stream starts and stops?: No Trouble starting stream?: No Do you have to strain to urinate?: No Blood in urine?: No Urinary tract infection?: No Sexually transmitted disease?: No Injury to kidneys or bladder?: No Painful intercourse?: No Weak stream?: No Erection problems?: No Penile pain?: No  Gastrointestinal Nausea?: No Vomiting?: No Indigestion/heartburn?: No Diarrhea?: No  Constipation?: No  Constitutional Fever: No Night sweats?: No Weight loss?: No Fatigue?: No  Skin Skin rash/lesions?: No Itching?: No  Eyes Blurred vision?: No Double vision?: No  Ears/Nose/Throat Sore throat?: No Sinus problems?: No  Hematologic/Lymphatic Swollen glands?: No Easy bruising?: No  Cardiovascular Leg swelling?: No Chest pain?: No  Respiratory Cough?: No Shortness of breath?: No  Endocrine Excessive thirst?: No  Musculoskeletal Back pain?: No Joint pain?: No  Neurological Headaches?: No Dizziness?: No  Psychologic Depression?: No Anxiety?: No  Physical Exam: BP 118/63 mmHg  Pulse 92  Ht 5\' 4"  (1.626 m)  Wt 147 lb 11.2 oz (66.996 kg)  BMI 25.34 kg/m2    Constitutional:  Alert and oriented, No acute distress. HEENT: [place] AT, moist mucus membranes.  Trachea midline, no masses. Cardiovascular: No clubbing, cyanosis, or edema. Respiratory: Normal respiratory effort, no increased work of breathing. GI: Abdomen is soft, nontender, nondistended, no abdominal masses GU: No CVA tenderness. Normal phallus. Testicles descended equally bilaterally. No masses. DRE: 3+ smooth no nodules. Skin: No rashes, bruises or suspicious lesions. Lymph: No cervical or inguinal adenopathy. Neurologic: Grossly intact, no focal deficits, moving all 4 extremities. Psychiatric: Normal mood and affect.  Laboratory Data: No results found for: WBC, HGB, HCT, MCV, PLT  No results found for: CREATININE  Lab Results  Component Value Date   PSA 10.6 07/14/2015    No results found for: TESTOSTERONE  No results found for: HGBA1C  Urinalysis    Component Value Date/Time   GLUCOSEU Negative 11/06/2015 1047   BILIRUBINUR Negative 11/06/2015 1047   NITRITE Negative 11/06/2015 1047   LEUKOCYTESUR Negative 11/06/2015 1047     Assessment & Plan:  I had very long discussion with the patient and his wife regarding his urinary retention. He is to continue his Flomax and finasteride. I also discussed the dangers of his significantly increased PVR which include urinary tract infection, further bladder decompensation, and irreversible renal damage. I again strongly recommended performing CIC twice a day. We discussed that he will probably need surgery to reduce the size of his prostate. We discussed cystoscopy in order to visualize his prostatic obstruction better. He is agreeable to proceed with this. He does also have a history of elevated PSA. I discussed the PSA test with the patient in great detail. He understands that it is a screening test for prostate cancer, however such things as a urinary tract infection, BPH, recent urinary tract instrumentation can also cause his PSA  rise. Since he will likely need surgical intervention for his BPH, I would like to rule out prostate cancer prior to that as it would change our surgical approach. Also, the ultrasound of his prostate during the biopsy would give [place] a better understanding of the true size of his prostate. We discussed the risks, benefits, indications for prostate biopsy. He understands the risks including infection and  Bleeding. He knows to expect blood in his stool, semen, and urine. Knowing this, he elected to proceed.  1. BPH with urinary retention -Continue Flomax, finasteride -Perform CIC twice per day and record outputs. The patient was again taught how to perform this in the office today. -Cystoscopy to evaluate degree of prostatic obstruction  2. Elevated PSA -prostate biopsy  3. Erectile dysfunction -Continue cialis   Return in about 2 weeks (around 12/25/2015) for cystoscopy and prostate biopsy.  Nickie Retort, MD  [facility] [address], [place], [place] [address] [phone]

## 2015-12-31 ENCOUNTER — Other Ambulatory Visit: Payer: Medicare Other

## 2016-01-05 ENCOUNTER — Ambulatory Visit (INDEPENDENT_AMBULATORY_CARE_PROVIDER_SITE_OTHER): Payer: Medicare Other | Admitting: Urology

## 2016-01-05 VITALS — BP 151/67 | HR 82 | Ht 65.0 in | Wt 148.0 lb

## 2016-01-05 DIAGNOSIS — R8281 Pyuria: Secondary | ICD-10-CM

## 2016-01-05 DIAGNOSIS — N400 Enlarged prostate without lower urinary tract symptoms: Secondary | ICD-10-CM | POA: Diagnosis not present

## 2016-01-05 DIAGNOSIS — N390 Urinary tract infection, site not specified: Secondary | ICD-10-CM

## 2016-01-05 DIAGNOSIS — R972 Elevated prostate specific antigen [PSA]: Secondary | ICD-10-CM

## 2016-01-05 LAB — URINALYSIS, COMPLETE
BILIRUBIN UA: NEGATIVE
Glucose, UA: NEGATIVE
KETONES UA: NEGATIVE
Nitrite, UA: POSITIVE — AB
PH UA: 7 (ref 5.0–7.5)
SPEC GRAV UA: 1.01 (ref 1.005–1.030)
UUROB: 0.2 mg/dL (ref 0.2–1.0)

## 2016-01-05 LAB — MICROSCOPIC EXAMINATION
Bacteria, UA: NONE SEEN
EPITHELIAL CELLS (NON RENAL): NONE SEEN /HPF (ref 0–10)
WBC, UA: >30 /hpf — AB (ref 0–?)

## 2016-01-05 LAB — BLADDER SCAN AMB NON-IMAGING

## 2016-01-05 MED ORDER — NITROFURANTOIN MONOHYD MACRO 100 MG PO CAPS: 100.0000 mg | ORAL_CAPSULE | Freq: Two times a day (BID) | ORAL | Status: AC

## 2016-01-05 MED ORDER — GENTAMICIN SULFATE 40 MG/ML IJ SOLN: 80.0000 mg | Freq: Once | INTRAMUSCULAR | Status: DC

## 2016-01-05 MED ORDER — LEVOFLOXACIN 500 MG PO TABS: 500.0000 mg | ORAL_TABLET | Freq: Once | ORAL | Status: DC

## 2016-01-05 NOTE — Progress Notes (Signed)
F/u -  1- BPH with lower urinary tract symptoms, incomplete bladder emptying  - significant lower urinary tract symptoms. primary care provider prescribed him Flomax which he reports has significantly improved his urinary symptoms. His PVR was 440. He was shown how to perform CIC.  Finasteride was added to tamsulosin. Repeat post void was 500 mL, but pt not bothered. AUASS 6/2.    2-elevated PSA- His PSA was 10.11 Jul 2015. PSA was repeated on 08/20/15 and remained elevated at 8.4. Prostate biopsy was recommended and patient declined. -Feb 2017 - normal DRE, 3+   3-erectile dysfunction - He reportedly had been taking Cialis 20 mg as needed for erectile issues but has not taken it for some time due to cost.    Today, patient is seen for the above. He returns today for cystoscopy and prostate biopsy, but developed over the past week difficulty catheterizing , frequency and dysuria. His UA is leukocyte and nitrate positive with greater than 30 white blood cells per high per field, however no bacteria were seen. This is a change for him.   Bladder scan pvr = 326 ml  CIC for PVR - 300 ml.  Patient was prepped in a catheter passed. There was some resistance at the prostate but the catheter then advanced normally.   A/P -  BPH with LUTS and inc bladder emptying, elevated PSA - possible UTI (new UA findings and new LUTS). Will send urine for Cx, start nitrofurantoin BID x 5 days and then QHS until he f/u for prostate biopsy and cystoscopy. Discussed again the nature, r/b/a to these procedures Pt is voiding better on combination tx and he may not need CIC.

## 2016-01-06 ENCOUNTER — Telehealth: Payer: Self-pay

## 2016-01-06 DIAGNOSIS — N400 Enlarged prostate without lower urinary tract symptoms: Secondary | ICD-10-CM

## 2016-01-06 MED ORDER — FINASTERIDE 5 MG PO TABS: 5.0000 mg | ORAL_TABLET | Freq: Every day | ORAL | Status: DC

## 2016-01-06 NOTE — Telephone Encounter (Signed)
Pt called stating he was in office yesterday and he was not able to get the cath through himself but the CMA was able to push through. Pt stated CMA made him aware that he may see some blood but hes concerned as to how long the blood may last. Reinforced with pt the blood could be due to infection or trama caused by pushing the cath through. As long as he is able to get the urine out the blood is normal. Reinforced to push fluids. Pt voiced understanding of conversation.

## 2016-01-07 ENCOUNTER — Telehealth: Payer: Self-pay

## 2016-01-07 LAB — CULTURE, URINE COMPREHENSIVE

## 2016-01-07 NOTE — Telephone Encounter (Signed)
Spoke with pt in reference to +ucx. Made aware to continue abx until cysto and bx. Pt voiced understanding.

## 2016-01-07 NOTE — Telephone Encounter (Signed)
-----   Message from Festus Aloe, MD sent at 01/07/2016  1:26 PM EST ----- Notify patient urine culture was positive and sensitive to the nitrofurantoin I put him on.  Continue until follow-up for cystoscopy and biopsy.   ----- Message -----    From: Lestine Box, LPN    Sent: [id number]   8:31 AM      To: Festus Aloe, MD    ----- Message -----    From: [facility] Lab Results In Interface    Sent: 01/05/2016   4:38 PM      To: [facility]

## 2016-01-08 ENCOUNTER — Ambulatory Visit: Payer: Medicare Other

## 2016-01-08 NOTE — Progress Notes (Signed)
Patient catheterizes indefinitely with a coud catheter due to the inability to pass a straight.

## 2016-01-14 ENCOUNTER — Ambulatory Visit: Payer: Medicare Other

## 2016-01-15 ENCOUNTER — Ambulatory Visit: Payer: Medicare Other

## 2016-01-20 ENCOUNTER — Ambulatory Visit (INDEPENDENT_AMBULATORY_CARE_PROVIDER_SITE_OTHER): Payer: Medicare Other | Admitting: Urology

## 2016-01-20 ENCOUNTER — Other Ambulatory Visit: Payer: Self-pay | Admitting: Urology

## 2016-01-20 ENCOUNTER — Encounter: Payer: Self-pay | Admitting: Urology

## 2016-01-20 VITALS — BP 149/67 | HR 87 | Ht 65.5 in | Wt 147.6 lb

## 2016-01-20 DIAGNOSIS — N400 Enlarged prostate without lower urinary tract symptoms: Secondary | ICD-10-CM | POA: Diagnosis not present

## 2016-01-20 DIAGNOSIS — R972 Elevated prostate specific antigen [PSA]: Secondary | ICD-10-CM

## 2016-01-20 LAB — URINALYSIS, COMPLETE
BILIRUBIN UA: NEGATIVE
GLUCOSE, UA: NEGATIVE
KETONES UA: NEGATIVE
Leukocytes, UA: NEGATIVE
Nitrite, UA: NEGATIVE
PH UA: 5.5 (ref 5.0–7.5)
PROTEIN UA: NEGATIVE
SPEC GRAV UA: 1.025 (ref 1.005–1.030)
UUROB: 0.2 mg/dL (ref 0.2–1.0)

## 2016-01-20 LAB — MICROSCOPIC EXAMINATION
Bacteria, UA: NONE SEEN
Epithelial Cells (non renal): NONE SEEN /hpf (ref 0–10)

## 2016-01-20 MED ORDER — LEVOFLOXACIN 500 MG PO TABS
500.0000 mg | ORAL_TABLET | Freq: Once | ORAL | Status: AC
  Administered 2016-01-20: 500 mg via ORAL

## 2016-01-20 MED ORDER — LIDOCAINE HCL 2 % EX GEL
1.0000 "application " | Freq: Once | CUTANEOUS | Status: AC
  Administered 2016-01-20: 1 via URETHRAL

## 2016-01-20 MED ORDER — GENTAMICIN SULFATE 40 MG/ML IJ SOLN
80.0000 mg | Freq: Once | INTRAMUSCULAR | Status: AC
  Administered 2016-01-20: 80 mg via INTRAMUSCULAR

## 2016-01-20 NOTE — Addendum Note (Signed)
Addended by: Wilson Singer on: 01/20/2016 04:23 PM   Modules accepted: Orders

## 2016-01-20 NOTE — Progress Notes (Signed)
Prostate Biopsy Procedure   Informed consent was obtained after discussing risks/benefits of the procedure.  A time out was performed to ensure correct patient identity.  Pre-Procedure: - Last PSA Level:  Lab Results  Component Value Date   PSA 10.6 07/14/2015   - Gentamicin given prophylactically - Levaquin 500 mg administered PO -Transrectal Ultrasound performed revealing a 41 gm prostate - large median lobe  Procedure: - Prostate block performed using 10 cc 1% lidocaine and biopsies taken from sextant areas, a total of 12 under ultrasound guidance.  Post-Procedure: - Patient tolerated the procedure well - He was counseled to seek immediate medical attention if experiences any severe pain, significant bleeding, or fevers - Return in one week to discuss biopsy results

## 2016-01-20 NOTE — Procedures (Signed)
    Cystoscopy Procedure Note  Patient identification was confirmed, informed consent was obtained, and patient was prepped using Betadine solution.  Lidocaine jelly was administered per urethral meatus.    Preoperative abx where received prior to procedure.     Pre-Procedure: - Inspection reveals a normal caliber ureteral meatus.  Procedure: The flexible cystoscope was introduced without difficulty - No urethral strictures/lesions are present. - Enlarged prostate with bilateral obstructing lobes, no median bar, prostatic urethra fairly short - Normal bladder neck - the left ureteral orifice was orthotopic, did not visualize the right UO - Bladder mucosa  reveals no ulcers, tumors, or lesions - No bladder stones - heavy trabeculations, large diverticulum on left posterior bladder wall  Retroflexion shows large intravesical median lobe   Post-Procedure: - Patient tolerated the procedure well

## 2016-01-23 LAB — PATHOLOGY REPORT

## 2016-01-26 ENCOUNTER — Other Ambulatory Visit: Payer: Self-pay

## 2016-01-26 ENCOUNTER — Telehealth: Payer: Self-pay

## 2016-01-26 DIAGNOSIS — R972 Elevated prostate specific antigen [PSA]: Secondary | ICD-10-CM

## 2016-01-26 NOTE — Telephone Encounter (Signed)
Spoke with pt in reference to prostate bx results, PSA, and ov. Pt stated that he has to reapply for his medcaid therefore he will have to call back later to make f/u appt and labs. Reinforced with pt the need for monitoring his PSA. Pt voiced understanding and stated he will call back as soon as he hears from medicaid.

## 2016-01-26 NOTE — Telephone Encounter (Signed)
-----   Message from Ardis Hughs, MD sent at 01/25/2016  1:51 PM EDT ----- Regarding: prostate biospy results Please call patient and let him know that his biopsy was negative.  He needs to be rescheduled for a PSA and OV in 6 months. Thanks, bh ----- Message -----    From: Royanne Foots, CMA    Sent: 01/23/2016   4:46 PM      To: Ardis Hughs, MD    ----- Message -----    From: [facility] Lab Results In Interface    Sent: 01/23/2016   4:40 PM      To: [facility]

## 2016-01-27 ENCOUNTER — Ambulatory Visit: Payer: Medicare Other

## 2016-02-18 ENCOUNTER — Ambulatory Visit: Payer: Medicare Other | Admitting: Obstetrics and Gynecology

## 2016-07-27 ENCOUNTER — Encounter: Payer: Self-pay | Admitting: Urology

## 2016-07-27 ENCOUNTER — Ambulatory Visit (INDEPENDENT_AMBULATORY_CARE_PROVIDER_SITE_OTHER): Payer: Medicare Other | Admitting: Urology

## 2016-07-27 DIAGNOSIS — R972 Elevated prostate specific antigen [PSA]: Secondary | ICD-10-CM | POA: Diagnosis not present

## 2016-07-27 DIAGNOSIS — N5201 Erectile dysfunction due to arterial insufficiency: Secondary | ICD-10-CM | POA: Diagnosis not present

## 2016-07-27 DIAGNOSIS — R339 Retention of urine, unspecified: Secondary | ICD-10-CM | POA: Diagnosis not present

## 2016-07-27 HISTORY — DX: Retention of urine, unspecified: R33.9

## 2016-07-27 LAB — BLADDER SCAN AMB NON-IMAGING: Scan Result: 0

## 2016-07-27 NOTE — Progress Notes (Signed)
07/27/2016 11:01 AM   Michael Edwards 10-Jun-1946 [id number]  Referring provider: [place]. [place], [place] [address]  No chief complaint on file.   HPI:  1- BPH with lower urinary tract symptoms, incomplete bladder emptying  - significant lower urinary tract symptoms with obstructive symptoms x years. Placed on max medicla therapy with tamsulosin + finasteride but PVR"s still 565mL plus. He is very facile with self cath BID-TID and is quite happy with this as adjunct to spontaneous voiding. He has known trilobar hypertrophy but does not desire TURP or any other possibly curative procedures.    2-elevated PSA-  07/2015 - 10.9  ---> NEGATIVE prostate biopsy 12/2015, 23mL with large median lobe. At age 70.   3-erectile dysfunction - He reportedly had been taking Cialis 20 mg as needed for erectile issues but has not taken it for some time due to cost.   Today "Michael Edwards" is seen in f/u above. No interval febrile UTI. He remains very satisfied on self-cath.    PMH: Past Medical History:  Diagnosis Date  . ED (erectile dysfunction)   . Elevated PSA    10.6 on 07/2015  . Enlarged prostate   . Left shoulder pain   . MI (myocardial infarction) [facility])     Surgical History: Past Surgical History:  Procedure Laterality Date  . blood clot removed from left upper leg    . VASECTOMY      Home Medications:    Medication List       Accurate as of 07/27/16 11:01 AM. Always use your most recent med list.          finasteride 5 MG tablet Commonly known as:  PROSCAR Take 1 tablet (5 mg total) by mouth daily.   multivitamin tablet Take 1 tablet by mouth daily.   tadalafil 20 MG tablet Commonly known as:  CIALIS Take 1 tablet (20 mg total) by mouth daily as needed for erectile dysfunction.   tamsulosin 0.4 MG Caps capsule Commonly known as:  FLOMAX       Allergies: No Known Allergies  Family History: Family History    Problem Relation Age of Onset  . Diabetes Sister   . Heart failure Sister   . Hematuria Mother     Social History:  reports that he has been smoking.  He has been smoking about 1.50 packs per day. He does not have any smokeless tobacco history on file. He reports that he drinks alcohol. He reports that he does not use drugs.    Review of Systems  Gastrointestinal (upper)  : Negative for upper GI symptoms  Gastrointestinal (lower) : Negative for lower GI symptoms  Constitutional : Negative for symptoms  Skin: Negative for skin symptoms  Eyes: Negative for eye symptoms  Ear/Nose/Throat : Negative for Ear/Nose/Throat symptoms  Hematologic/Lymphatic: Negative for Hematologic/Lymphatic symptoms  Cardiovascular : Negative for cardiovascular symptoms  Respiratory : Negative for respiratory symptoms  Endocrine: Negative for endocrine symptoms  Musculoskeletal: Negative for musculoskeletal symptoms  Neurological: Negative for neurological symptoms  Psychologic: Negative for psychiatric symptoms    Physical Exam: There were no vitals taken for this visit.  Constitutional:  Alert and oriented, No acute distress. HEENT:  AT, moist mucus membranes.  Trachea midline, no masses. Cardiovascular: No clubbing, cyanosis, or edema. Respiratory: Normal respiratory effort, no increased work of breathing. GI: Abdomen is soft, nontender, nondistended, no abdominal masses GU: No CVA tenderness. Circ'd phallus. Rt upper pole spermatocele.  Skin: No rashes, bruises or suspicious lesions. Lymph: No cervical or inguinal adenopathy. Neurologic: Grossly intact, no focal deficits, moving all 4 extremities. Psychiatric: Normal mood and affect.  Laboratory Data: No results found for: WBC, HGB, HCT, MCV, PLT  No results found for: CREATININE  Lab Results  Component Value Date   PSA 10.6 07/14/2015    No results found for: TESTOSTERONE  No results found for:  HGBA1C  Urinalysis    Component Value Date/Time   APPEARANCEUR Clear 01/20/2016 1341   GLUCOSEU Negative 01/20/2016 1341   BILIRUBINUR Negative 01/20/2016 1341   PROTEINUR Negative 01/20/2016 1341   NITRITE Negative 01/20/2016 1341   LEUKOCYTESUR Negative 01/20/2016 1341      Assessment & Plan:   1- BPH with lower urinary tract symptoms, incomplete bladder emptying  - Discussed continued self cath v.   proceed with UDS and then possible TURP if bladder contractions preserved.  He wants to continue self-cath. Fortunatley he is quite facile at this.   Rec yearly surveillance with exam and BMP and he is amenable.    2-elevated PSA-  negative BX over age 40, no role for further screening.    3-erectile dysfunction - continiue prn PDE5i.  RTC 1 year with BMP piror.   Alexis Frock, [facility] [address] [place], [place] [address] [phone]

## 2017-01-04 DIAGNOSIS — K469 Unspecified abdominal hernia without obstruction or gangrene: Secondary | ICD-10-CM | POA: Diagnosis not present

## 2017-01-04 DIAGNOSIS — R109 Unspecified abdominal pain: Secondary | ICD-10-CM | POA: Diagnosis not present

## 2017-01-07 DIAGNOSIS — I219 Acute myocardial infarction, unspecified: Secondary | ICD-10-CM | POA: Insufficient documentation

## 2017-01-17 ENCOUNTER — Ambulatory Visit (INDEPENDENT_AMBULATORY_CARE_PROVIDER_SITE_OTHER): Payer: Medicare Other | Admitting: General Surgery

## 2017-01-17 ENCOUNTER — Encounter: Payer: Self-pay | Admitting: General Surgery

## 2017-01-17 VITALS — BP 143/87 | HR 97 | Temp 98.0°F | Ht 63.0 in | Wt 142.4 lb

## 2017-01-17 DIAGNOSIS — K4090 Unilateral inguinal hernia, without obstruction or gangrene, not specified as recurrent: Secondary | ICD-10-CM | POA: Insufficient documentation

## 2017-01-17 NOTE — Patient Instructions (Signed)
We have scheduled your surgery for 02/03/17 with Dr.Woodham at [facility]. Please see the blue pre-care sheet for surgery information. Please call our office if you have questions or concerns.

## 2017-01-17 NOTE — Progress Notes (Signed)
Patient ID: Michael Edwards, male   DOB: April 26, 1946, 71 y.o.   MRN: [id number]  CC: Left groin bulge.  HPI Michael Edwards is a 71 y.o. male presents to clinic today for evaluation of a left groin bulge. He is on sure how long it has been there but states for at least last couple years he has noticed an occasional bulge that requires manual reduction. He states his never been stuck out. He only occasionally causes a twinge of pain primarily when is being reduced. He notices it stick out mostly when he goes from sitting to standing. He does not recall any trauma or any difficulties with bowel or bladder function. He denies any fevers, chills, nausea, vomiting, chest pain, shortness of breath, diarrhea, constipation.  HPI  Past Medical History:  Diagnosis Date  . Cardiac murmur 08/19/2015  . ED (erectile dysfunction)   . Elevated PSA    10.6 on 07/2015  . Enlarged prostate   . Erectile dysfunction due to arterial insufficiency 07/27/2016  . Incomplete bladder emptying 07/27/2016  . Left shoulder pain   . MI (myocardial infarction)   . Myocardial infarction 08/19/2015    Past Surgical History:  Procedure Laterality Date  . blood clot removed from left upper leg    . VASECTOMY      Family History  Problem Relation Age of Onset  . Hematuria Mother   . Diabetes Sister   . Heart failure Sister     Social History Social History  Substance Use Topics  . Smoking status: Current Every Day Smoker    Packs/day: 1.50  . Smokeless tobacco: Never Used  . Alcohol use Yes     Comment: 6 beers a week    No Known Allergies  Current Outpatient Prescriptions  Medication Sig Dispense Refill  . finasteride (PROSCAR) 5 MG tablet Take 1 tablet (5 mg total) by mouth daily. 30 tablet 3  . lovastatin (MEVACOR) 20 MG tablet     . Multiple Vitamin (MULTIVITAMIN) tablet Take 1 tablet by mouth daily.    . tadalafil (CIALIS) 20 MG tablet Take 1 tablet (20 mg total) by mouth daily as needed for  erectile dysfunction. 18 tablet 0  . tamsulosin (FLOMAX) 0.4 MG CAPS capsule      No current facility-administered medications for this visit.      Review of Systems A Multi-point review of systems was asked and was negative except for the findings documented in the history of present illness  Physical Exam Blood pressure (!) 143/87, pulse 97, temperature 98 F (36.7 C), temperature source Oral, height 5\' 3"  (1.6 m), weight 64.6 kg (142 lb 6.4 oz). CONSTITUTIONAL: No acute distress. EYES: Pupils are equal, round, and reactive to light, Sclera are non-icteric. EARS, NOSE, MOUTH AND THROAT: The oropharynx is clear. The oral mucosa is pink and moist. Hearing is intact to voice. LYMPH NODES:  Lymph nodes in the neck are normal. RESPIRATORY:  Lungs are clear. There is normal respiratory effort, with equal breath sounds bilaterally, and without pathologic use of accessory muscles. CARDIOVASCULAR: Heart is regular but with a pansystolic murmurs, gallops, or rubs. GI: The abdomen is soft, nontender, and nondistended. There is a visible left groin bulge that is easily reducible there is no evidence of any right groin pathology. There is no hepatosplenomegaly. There are normal bowel sounds in all quadrants. GU: Rectal deferred.   MUSCULOSKELETAL: Normal muscle strength and tone. No cyanosis or edema.   SKIN: Turgor is good and there  are no pathologic skin lesions or ulcers. NEUROLOGIC: Motor and sensation is grossly normal. Cranial nerves are grossly intact. PSYCH:  Oriented to person, place and time. Affect is normal.  Data Reviewed No recent images and labs to review I have personally reviewed the patient's imaging, laboratory findings and medical records.    Assessment    Left inguinal hernia    Plan    71 year old male with a symptomatic left inguinal hernia. Discussed the diagnosis at length as well as the treatment options of open versus laparoscopic hernia repair. Both repairs were  described in detail and after this discussion patient elects to have an open inguinal hernia repair. Discussed the primary risks in detail to include pain, bleeding, infection, damage to surrounding structures including nerves or testicular vessels. Discussed that this could possibly lead to loss of the testicle on that side. He voiced understanding and desires to proceed. Plan for an elective open left inguinal hernia repair on Thursday, April 5.     Time spent with the patient was 45 minutes, with more than 50% of the time spent in face-to-face education, counseling and care coordination.     Clayburn Pert, MD FACS General Surgeon 01/17/2017, 3:05 PM

## 2017-01-18 ENCOUNTER — Telehealth: Payer: Self-pay

## 2017-01-18 NOTE — Telephone Encounter (Signed)
No authorization required for CPT code 49505-Patient has Medicare A&B.

## 2017-01-18 NOTE — Telephone Encounter (Signed)
Patient has been advised of Surgery Date as well as Pre-Admission appointment date, time, and location.  Surgery Date: 02/03/17  Pre-admit Appointment: 01/24/17 from 9a-1p (Phone)  Patient has been advised to call [phone] the day before surgery between 1-3pm to obtain arrival time.

## 2017-01-19 ENCOUNTER — Telehealth: Payer: Self-pay

## 2017-01-19 NOTE — Telephone Encounter (Signed)
Spoke with patient at this time. He has not been seen by Dr. Clayborn Bigness since 2016, Cardiac Clearance is needed prior to surgery. Appointment made with [facility] 01/20/17 @ 11:15 am.   Patient notified of appointment and verbalized understanding.

## 2017-01-20 DIAGNOSIS — Z01818 Encounter for other preprocedural examination: Secondary | ICD-10-CM | POA: Diagnosis not present

## 2017-01-20 DIAGNOSIS — F17200 Nicotine dependence, unspecified, uncomplicated: Secondary | ICD-10-CM | POA: Diagnosis not present

## 2017-01-20 DIAGNOSIS — K4090 Unilateral inguinal hernia, without obstruction or gangrene, not specified as recurrent: Secondary | ICD-10-CM | POA: Diagnosis not present

## 2017-01-20 DIAGNOSIS — E785 Hyperlipidemia, unspecified: Secondary | ICD-10-CM | POA: Diagnosis not present

## 2017-01-20 DIAGNOSIS — R011 Cardiac murmur, unspecified: Secondary | ICD-10-CM | POA: Diagnosis not present

## 2017-01-20 DIAGNOSIS — N400 Enlarged prostate without lower urinary tract symptoms: Secondary | ICD-10-CM | POA: Diagnosis not present

## 2017-01-21 ENCOUNTER — Telehealth: Payer: Self-pay

## 2017-01-21 NOTE — Telephone Encounter (Signed)
Cardiac Clearance is obtained at this time from [facility] and will be scanned under Media.

## 2017-01-24 ENCOUNTER — Inpatient Hospital Stay: Admission: RE | Admit: 2017-01-24 | Payer: Medicare Other | Source: Ambulatory Visit

## 2017-01-24 MED ORDER — ESTROGENS, CONJUGATED 0.625 MG/GM VA CREA
TOPICAL_CREAM | VAGINAL | Status: AC
  Filled 2017-01-24: qty 30

## 2017-01-27 ENCOUNTER — Encounter
Admission: RE | Admit: 2017-01-27 | Discharge: 2017-01-27 | Disposition: A | Discharge status: Home / Self Care | Payer: Medicare Other | Source: Ambulatory Visit | Attending: General Surgery | Admitting: General Surgery

## 2017-01-27 ENCOUNTER — Ambulatory Visit
Admission: RE | Admit: 2017-01-27 | Discharge: 2017-01-27 | Disposition: A | Discharge status: Home / Self Care | Payer: Medicare Other | Source: Ambulatory Visit | Attending: General Surgery | Admitting: General Surgery

## 2017-01-27 DIAGNOSIS — Z7982 Long term (current) use of aspirin: Secondary | ICD-10-CM | POA: Insufficient documentation

## 2017-01-27 DIAGNOSIS — Z01810 Encounter for preprocedural cardiovascular examination: Secondary | ICD-10-CM

## 2017-01-27 DIAGNOSIS — K4090 Unilateral inguinal hernia, without obstruction or gangrene, not specified as recurrent: Secondary | ICD-10-CM | POA: Diagnosis not present

## 2017-01-27 DIAGNOSIS — Z79899 Other long term (current) drug therapy: Secondary | ICD-10-CM | POA: Insufficient documentation

## 2017-01-27 DIAGNOSIS — Z01818 Encounter for other preprocedural examination: Secondary | ICD-10-CM | POA: Diagnosis not present

## 2017-01-27 LAB — CBC WITH DIFFERENTIAL/PLATELET
Basophils Absolute: 0.1 10*3/uL (ref 0–0.1)
Basophils Relative: 1 %
Eosinophils Absolute: 0.1 10*3/uL (ref 0–0.7)
Eosinophils Relative: 1 %
HEMATOCRIT: 45.3 % (ref 40.0–52.0)
HEMOGLOBIN: 15.4 g/dL (ref 13.0–18.0)
LYMPHS ABS: 2.9 10*3/uL (ref 1.0–3.6)
Lymphocytes Relative: 26 %
MCH: 30.7 pg (ref 26.0–34.0)
MCHC: 34 g/dL (ref 32.0–36.0)
MCV: 90.2 fL (ref 80.0–100.0)
MONOS PCT: 7 %
Monocytes Absolute: 0.7 10*3/uL (ref 0.2–1.0)
NEUTROS PCT: 65 %
Neutro Abs: 7.4 10*3/uL — ABNORMAL HIGH (ref 1.4–6.5)
Platelets: 254 10*3/uL (ref 150–440)
RBC: 5.02 MIL/uL (ref 4.40–5.90)
RDW: 13.3 % (ref 11.5–14.5)
WBC: 11.3 10*3/uL — ABNORMAL HIGH (ref 3.8–10.6)

## 2017-01-27 LAB — PROTIME-INR
INR: 1.01
Prothrombin Time: 13.3 seconds (ref 11.4–15.2)

## 2017-01-27 LAB — BASIC METABOLIC PANEL
ANION GAP: 9 (ref 5–15)
BUN: 12 mg/dL (ref 6–20)
CHLORIDE: 104 mmol/L (ref 101–111)
CO2: 27 mmol/L (ref 22–32)
Calcium: 9.5 mg/dL (ref 8.9–10.3)
Creatinine, Ser: 1.01 mg/dL (ref 0.61–1.24)
GFR calc Af Amer: >60 mL/min (ref >60)
GFR calc non Af Amer: >60 mL/min (ref >60)
Glucose, Bld: 82 mg/dL (ref 65–99)
Potassium: 4.3 mmol/L (ref 3.5–5.1)
Sodium: 140 mmol/L (ref 135–145)

## 2017-01-27 MED ORDER — CHLORHEXIDINE GLUCONATE CLOTH 2 % EX PADS
6.0000 | MEDICATED_PAD | Freq: Once | CUTANEOUS | Status: DC
  Filled 2017-01-27: qty 6

## 2017-01-27 NOTE — Patient Instructions (Signed)
Your procedure is scheduled on: 02/03/17 Thurs Report to Same Day Surgery 2nd floor medical mall [facility] Entrance-take elevator on left to 2nd floor.  Check in with surgery information desk.) To find out your arrival time please call [phone] between 1PM - 3PM on 02/02/17 Wed  Remember: Instructions that are not followed completely may result in serious medical risk, up to and including death, or upon the discretion of your surgeon and anesthesiologist your surgery may need to be rescheduled.    _x___ 1. Do not eat food or drink liquids after midnight. No gum chewing or                              hard candies.     __x__ 2. No Alcohol for 24 hours before or after surgery.   __x__3. No Smoking for 24 prior to surgery.   ____  4. Bring all medications with you on the day of surgery if instructed.    __x__ 5. Notify your doctor if there is any change in your medical condition     (cold, fever, infections).     Do not wear jewelry, make-up, hairpins, clips or nail polish.  Do not wear lotions, powders, or perfumes. You may wear deodorant.  Do not shave 48 hours prior to surgery. Men may shave face and neck.  Do not bring valuables to the hospital.    [facility] is not responsible for any belongings or valuables.               Contacts, dentures or bridgework may not be worn into surgery.  Leave your suitcase in the car. After surgery it may be brought to your room.  For patients admitted to the hospital, discharge time is determined by your                       treatment team.   Patients discharged the day of surgery will not be allowed to drive home.  You will need someone to drive you home and stay with you the night of your procedure.    Please read over the following fact sheets that you were given:   [facility] Preparing for Surgery and or MRSA Information   _x___ Take anti-hypertensive (unless it includes a diuretic), cardiac, seizure, asthma,     anti-reflux and  psychiatric medicines. These include:  1. lovastatin (MEVACOR  2.  3.  4.  5.  6.  ____Fleets enema or Magnesium Citrate as directed.   _x___ Use CHG Soap or sage wipes as directed on instruction sheet   ____ Use inhalers on the day of surgery and bring to hospital day of surgery  ____ Stop Metformin and Janumet 2 days prior to surgery.    ____ Take 1/2 of usual insulin dose the night before surgery and none on the morning     surgery.   _x___ Follow recommendations from Cardiologist, Pulmonologist or PCP regarding          stopping Aspirin, Coumadin, Pllavix ,Eliquis, Effient, or Pradaxa, and Pletal.  X____Stop Anti-inflammatories such as Advil, Aleve, Ibuprofen, Motrin, Naproxen, Naprosyn, Goodies powders or aspirin products. OK to take Tylenol and                          Celebrex.   _x___ Stop supplements until after surgery.  But may continue Vitamin D, Vitamin  B,       and multivitamin.   ____ Bring C-Pap to the hospital.

## 2017-01-27 NOTE — Pre-Procedure Instructions (Signed)
15 AM EDT Formatting of this note may be different from the original. New Patient Visit   Chief Complaint: Chief Complaint  Patient presents with  . Follow-up  RECHECK PER PT  . Pre-op Exam  HERNIA REPAIR  Date of Service: 01/20/2017 Date of Birth: November 04, 1945 PCP: [facility]  History of Present Illness: Mr. Michael Edwards is a 71 y.o.male patient who referred Dr. Elsworth Soho for preoperative assessment prior to hernia surgery. Patient states to have had a cardiac history reportedly may have had a small myocardial infarction while living in [place] while he was in his 25s he has never had a cardiac catheterization. Patient has developed a hernia on the left side which is grown and become difficult to reduce and is causing some pain and is not ready to have it fixed. Patient has a son with ADHD and is autistic that he has to care for his 71 year old. 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 Unfortunately the patient still smokes not been on any cardiac meds.  Past Medical and Surgical History  Past Medical History Past Medical History:  Diagnosis Date  . Heart murmur, unspecified  . Myocardial infarction   Past Surgical History He has a past surgical history that includes Removal Blood Clot From Anterior Segment; Ankle arthroscopy; and Tonsillectomy.   Medications and Allergies  Current Medications Current Outpatient Prescriptions  Medication Sig Dispense Refill  . tadalafil (CIALIS) 20 MG tablet Take by mouth.  Marland Kitchen aspirin 81 MG EC tablet Take 1 tablet (81 mg total) by mouth once daily. 30 tablet 11  . lovastatin (MEVACOR) 20 MG tablet Take 20 mg by mouth nightly. 1  . multivitamin (MULTIVITAMIN) tablet Take by mouth.  . tamsulosin (FLOMAX) 0.4 mg capsule   No current facility-administered medications for this visit.   Allergies Patient has no known allergies.  Social and Family History  Social History reports  that he has been smoking. He has a 50.00 pack-year smoking history. He has never used smokeless tobacco. He reports that he drinks alcohol. He reports that he does not use drugs.  Family History family history includes High blood pressure (Hypertension) in his father and mother.   Review of Systems   Review of Systems: The patient denies chest pain, shortness of breath, orthopnea, paroxysmal nocturnal dyspnea, pedal edema, palpitations, heart racing, presyncope, syncope. Review of 12 Systems is negative except as described above.  Physical Examination   Vitals:BP 128/64  Pulse 80  Ht 165.1 cm (5\' 5" )  Wt 65.3 kg (144 lb)  SpO2 98%  BMI 23.96 kg/m  Ht:165.1 cm (5\' 5" ) Wt:65.3 kg (144 lb) EYC:XKGY surface area is 1.73 meters squared. Body mass index is 23.96 kg/m.  HEENT: Pupils equally reactive to light and accomodation  Neck: Supple without thyromegaly, carotid pulses 2+ Lungs: clear to auscultation bilaterally; no wheezes, rales, rhonchi Heart: Regular rate and rhythm. No gallops, murmurs or rub Abdomen: soft nontender, nondistended, with normal bowel sounds Extremities: no cyanosis, clubbing, or edema Peripheral Pulses: 2+ in all extremities, 2+ femoral pulses bilaterally  Assessment   71 y.o. male with  1. Hyperlipidemia, unspecified hyperlipidemia type  2. Pre-operative clearance  3. Murmur, cardiac, unspecified  4. Smoking  5. Benign prostatic hyperplasia, unspecified whether lower urinary tract symptoms present  6. Hernia, inguinal, left   Plan  1 preoperative clearance for hernia surgery no recent cardiac symptoms most recent cardiac workup was November 2016 which was unremarkable patient appears  to be an acceptable surgical risk 2 murmur reasonably stable appears to be a flow murmur no significant abnormalities recommend conservative medical therapy 3 smoking advised patient to refrain from tobacco abuse 4 hyperlipidemia consider statin therapy 5 coronary disease  reported myocardial infarction while living in [place] while he was in his 34s essentially recently unremarkable 6 BPH continue tamsulosin as necessary  Return in about 1 year (around 01/20/2018).  DWAYNE Prince Rome, MD       Plan of Treatment - as of this encounter  Not on file   Visit Diagnoses    Diagnosis  Hyperlipidemia, unspecified hyperlipidemia type - Primary  Pre-operative clearance  Unspecified pre-operative examination   Murmur, cardiac, unspecified  Undiagnosed cardiac murmurs   Smoking  Tobacco use disorder   Benign prostatic hyperplasia, unspecified whether lower urinary tract symptoms present  Hernia, inguinal, left

## 2017-01-27 NOTE — Pre-Procedure Instructions (Signed)
Cardiac clearance on chart Dr Clayborn Bigness.

## 2017-02-03 ENCOUNTER — Ambulatory Visit: Payer: Medicare Other | Admitting: Anesthesiology

## 2017-02-03 ENCOUNTER — Encounter
Admission: RE | Disposition: A | Discharge status: Home / Self Care | Payer: Self-pay | Source: Ambulatory Visit | Attending: General Surgery

## 2017-02-03 ENCOUNTER — Ambulatory Visit
Admission: RE | Admit: 2017-02-03 | Discharge: 2017-02-03 | Disposition: A | Discharge status: Home / Self Care | Payer: Medicare Other | Source: Ambulatory Visit | Attending: General Surgery | Admitting: General Surgery

## 2017-02-03 DIAGNOSIS — F17210 Nicotine dependence, cigarettes, uncomplicated: Secondary | ICD-10-CM | POA: Diagnosis not present

## 2017-02-03 DIAGNOSIS — I252 Old myocardial infarction: Secondary | ICD-10-CM | POA: Insufficient documentation

## 2017-02-03 DIAGNOSIS — Z79899 Other long term (current) drug therapy: Secondary | ICD-10-CM | POA: Insufficient documentation

## 2017-02-03 DIAGNOSIS — N400 Enlarged prostate without lower urinary tract symptoms: Secondary | ICD-10-CM | POA: Diagnosis not present

## 2017-02-03 DIAGNOSIS — Z7982 Long term (current) use of aspirin: Secondary | ICD-10-CM | POA: Diagnosis not present

## 2017-02-03 DIAGNOSIS — K4090 Unilateral inguinal hernia, without obstruction or gangrene, not specified as recurrent: Secondary | ICD-10-CM | POA: Insufficient documentation

## 2017-02-03 HISTORY — PX: INGUINAL HERNIA REPAIR: SHX194

## 2017-02-03 LAB — GLUCOSE, CAPILLARY: Glucose-Capillary: 93 mg/dL (ref 65–99)

## 2017-02-03 SURGERY — REPAIR, HERNIA, INGUINAL, ADULT
Anesthesia: General | Laterality: Left | Wound class: Clean

## 2017-02-03 MED ORDER — ONDANSETRON HCL 4 MG/2ML IJ SOLN: 4.0000 mg | Freq: Once | INTRAMUSCULAR | Status: DC | PRN

## 2017-02-03 MED ORDER — FENTANYL CITRATE (PF) 100 MCG/2ML IJ SOLN: 25.0000 ug | INTRAMUSCULAR | Status: DC | PRN

## 2017-02-03 MED ORDER — SODIUM CHLORIDE 0.9 % IV SOLN
INTRAVENOUS | Status: DC | PRN
  Administered 2017-02-03: 40 mL

## 2017-02-03 MED ORDER — HYDROCODONE-ACETAMINOPHEN 5-325 MG PO TABS: 1.0000 | ORAL_TABLET | Freq: Four times a day (QID) | ORAL | 0 refills | Status: DC | PRN

## 2017-02-03 MED ORDER — ONDANSETRON HCL 4 MG/2ML IJ SOLN
INTRAMUSCULAR | Status: AC
  Filled 2017-02-03: qty 2

## 2017-02-03 MED ORDER — EPHEDRINE SULFATE 50 MG/ML IJ SOLN
INTRAMUSCULAR | Status: DC | PRN
  Administered 2017-02-03: 5 mg via INTRAVENOUS

## 2017-02-03 MED ORDER — PROPOFOL 10 MG/ML IV BOLUS
INTRAVENOUS | Status: DC | PRN
  Administered 2017-02-03: 140 mg via INTRAVENOUS

## 2017-02-03 MED ORDER — LIDOCAINE HCL (PF) 2 % IJ SOLN
INTRAMUSCULAR | Status: AC
  Filled 2017-02-03: qty 2

## 2017-02-03 MED ORDER — DEXAMETHASONE SODIUM PHOSPHATE 10 MG/ML IJ SOLN
INTRAMUSCULAR | Status: AC
  Filled 2017-02-03: qty 1

## 2017-02-03 MED ORDER — LIDOCAINE HCL (PF) 1 % IJ SOLN
INTRAMUSCULAR | Status: AC
  Filled 2017-02-03: qty 30

## 2017-02-03 MED ORDER — GLYCOPYRROLATE 0.2 MG/ML IJ SOLN
INTRAMUSCULAR | Status: DC | PRN
  Administered 2017-02-03: .2 mg via INTRAVENOUS

## 2017-02-03 MED ORDER — CEFAZOLIN SODIUM-DEXTROSE 2-4 GM/100ML-% IV SOLN
2.0000 g | INTRAVENOUS | Status: AC
  Administered 2017-02-03: 2 g via INTRAVENOUS

## 2017-02-03 MED ORDER — FENTANYL CITRATE (PF) 100 MCG/2ML IJ SOLN
INTRAMUSCULAR | Status: AC
  Filled 2017-02-03: qty 2

## 2017-02-03 MED ORDER — LIDOCAINE HCL (CARDIAC) 20 MG/ML IV SOLN
INTRAVENOUS | Status: DC | PRN
  Administered 2017-02-03: 60 mg via INTRAVENOUS

## 2017-02-03 MED ORDER — FAMOTIDINE 20 MG PO TABS
ORAL_TABLET | ORAL | Status: AC
  Administered 2017-02-03: 20 mg via ORAL
  Filled 2017-02-03: qty 1

## 2017-02-03 MED ORDER — PROPOFOL 10 MG/ML IV BOLUS
INTRAVENOUS | Status: AC
  Filled 2017-02-03: qty 20

## 2017-02-03 MED ORDER — ONDANSETRON HCL 4 MG/2ML IJ SOLN
INTRAMUSCULAR | Status: DC | PRN
Start: 2017-02-03 — End: 2017-02-03
  Administered 2017-02-03: 4 mg via INTRAVENOUS

## 2017-02-03 MED ORDER — LIDOCAINE HCL (PF) 1 % IJ SOLN
INTRAMUSCULAR | Status: DC | PRN
Start: 2017-02-03 — End: 2017-02-03
  Administered 2017-02-03: 10 mL

## 2017-02-03 MED ORDER — FAMOTIDINE 20 MG PO TABS
20.0000 mg | ORAL_TABLET | Freq: Once | ORAL | Status: AC
  Administered 2017-02-03: 20 mg via ORAL

## 2017-02-03 MED ORDER — FENTANYL CITRATE (PF) 100 MCG/2ML IJ SOLN
INTRAMUSCULAR | Status: DC | PRN
  Administered 2017-02-03 (×4): 25 ug via INTRAVENOUS

## 2017-02-03 MED ORDER — MIDAZOLAM HCL 2 MG/2ML IJ SOLN
INTRAMUSCULAR | Status: AC
  Filled 2017-02-03: qty 2

## 2017-02-03 MED ORDER — SODIUM CHLORIDE 0.9 % IV SOLN
INTRAVENOUS | Status: DC
  Administered 2017-02-03: 06:00:00 via INTRAVENOUS

## 2017-02-03 MED ORDER — PHENYLEPHRINE HCL 10 MG/ML IJ SOLN
INTRAMUSCULAR | Status: DC | PRN
  Administered 2017-02-03 (×2): 100 ug via INTRAVENOUS
  Administered 2017-02-03: 50 ug via INTRAVENOUS
  Administered 2017-02-03: 100 ug via INTRAVENOUS
  Administered 2017-02-03: 200 ug via INTRAVENOUS

## 2017-02-03 MED ORDER — CEFAZOLIN SODIUM-DEXTROSE 2-4 GM/100ML-% IV SOLN
INTRAVENOUS | Status: AC
  Administered 2017-02-03: 2 g via INTRAVENOUS
  Filled 2017-02-03: qty 100

## 2017-02-03 MED ORDER — MIDAZOLAM HCL 2 MG/2ML IJ SOLN
INTRAMUSCULAR | Status: DC | PRN
  Administered 2017-02-03: 2 mg via INTRAVENOUS

## 2017-02-03 MED ORDER — DEXAMETHASONE SODIUM PHOSPHATE 10 MG/ML IJ SOLN
INTRAMUSCULAR | Status: DC | PRN
  Administered 2017-02-03: 10 mg via INTRAVENOUS

## 2017-02-03 SURGICAL SUPPLY — 32 items
BLADE SURG 15 STRL LF DISP TIS (BLADE) ×1 IMPLANT
BLADE SURG 15 STRL SS (BLADE) ×2
CHLORAPREP W/TINT 26ML (MISCELLANEOUS) ×3 IMPLANT
DERMABOND ADVANCED (GAUZE/BANDAGES/DRESSINGS) ×2
DERMABOND ADVANCED .7 DNX12 (GAUZE/BANDAGES/DRESSINGS) ×1 IMPLANT
DRAIN PENROSE 1/4X12 LTX (DRAIN) ×3 IMPLANT
DRAIN PENROSE 5/8X18 LTX STRL (WOUND CARE) IMPLANT
DRAPE LAPAROTOMY 100X77 ABD (DRAPES) ×3 IMPLANT
DRAPE SHEET LG 3/4 BI-LAMINATE (DRAPES) IMPLANT
ELECT CAUTERY BLADE 6.4 (BLADE) ×3 IMPLANT
ELECT REM PT RETURN 9FT ADLT (ELECTROSURGICAL) ×3
ELECTRODE REM PT RTRN 9FT ADLT (ELECTROSURGICAL) ×1 IMPLANT
GLOVE BIO SURGEON STRL SZ7.5 (GLOVE) ×12 IMPLANT
GLOVE INDICATOR 8.0 STRL GRN (GLOVE) ×3 IMPLANT
GOWN STRL REUS W/ TWL LRG LVL3 (GOWN DISPOSABLE) ×3 IMPLANT
GOWN STRL REUS W/TWL LRG LVL3 (GOWN DISPOSABLE) ×6
KIT RM TURNOVER STRD PROC AR (KITS) ×3 IMPLANT
LABEL OR SOLS (LABEL) ×3 IMPLANT
MESH PARIETEX PROGRIP LEFT (Mesh General) ×3 IMPLANT
NDL SAFETY 25GX1.5 (NEEDLE) ×6 IMPLANT
NS IRRIG 500ML POUR BTL (IV SOLUTION) ×3 IMPLANT
PACK BASIN MINOR ARMC (MISCELLANEOUS) ×3 IMPLANT
SUT ETHIBOND 0 MO6 C/R (SUTURE) ×3 IMPLANT
SUT MNCRL 4-0 (SUTURE) ×2
SUT MNCRL 4-0 27XMFL (SUTURE) ×1
SUT SILK 2 0 SH (SUTURE) IMPLANT
SUT VIC AB 2-0 CT2 27 (SUTURE) ×9 IMPLANT
SUT VIC AB 3-0 SH 27 (SUTURE) ×2
SUT VIC AB 3-0 SH 27X BRD (SUTURE) ×1 IMPLANT
SUTURE MNCRL 4-0 27XMF (SUTURE) ×1 IMPLANT
SYR 20CC LL (SYRINGE) ×3 IMPLANT
SYRINGE 10CC LL (SYRINGE) ×3 IMPLANT

## 2017-02-03 NOTE — Brief Op Note (Signed)
02/03/2017  11:57 AM  PATIENT:  Michael Edwards  71 y.o. male  PRE-OPERATIVE DIAGNOSIS:  left inguinal hernia  POST-OPERATIVE DIAGNOSIS:  left inguinal hernia  PROCEDURE:  Procedure(s): HERNIA REPAIR INGUINAL ADULT (Left)  SURGEON:  Surgeon(s) and Role:    * Clayburn Pert, MD - Primary  PHYSICIAN ASSISTANT:   ASSISTANTS: none   ANESTHESIA:   general  EBL:  No intake/output data recorded.  BLOOD ADMINISTERED:none  DRAINS: none   LOCAL MEDICATIONS USED:  OTHER Lidocaine and Exparel  SPECIMEN:  No Specimen  DISPOSITION OF SPECIMEN:  N/A  COUNTS:  YES  TOURNIQUET:  * No tourniquets in log *  DICTATION: .Dragon Dictation  PLAN OF CARE: Discharge to home after PACU  PATIENT DISPOSITION:  PACU - hemodynamically stable.   Delay start of Pharmacological VTE agent (>24hrs) due to surgical blood loss or risk of bleeding: not applicable

## 2017-02-03 NOTE — Discharge Instructions (Signed)
PATIENT INSTRUCTIONS HERNIA  FOLLOW-UP:  Please make an appointment with your physician in 1 week(s).  Call your physician immediately if you have any fevers greater than 102.5, drainage from you wound that is not clear or looks infected, persistent bleeding, increasing abdominal pain, problems urinating, or persistent nausea/vomiting.    WOUND CARE INSTRUCTIONS:  Keep a dry clean dressing on the wound if there is drainage. The initial bandage may be removed after 24 hours.  Once the wound has quit draining you may leave it open to air.  If clothing rubs against the wound or causes irritation and the wound is not draining you may cover it with a dry dressing during the daytime.  Try to keep the wound dry and avoid ointments on the wound unless directed to do so.  If the wound becomes bright red and painful or starts to drain infected material that is not clear, please contact your physician immediately.  If the wound is mildly pink and has a thick firm ridge underneath it, this is normal, and is referred to as a healing ridge.  This will resolve over the next 4-6 weeks.  DIET:  You may eat any foods that you can tolerate.  It is a good idea to eat a high fiber diet and take in plenty of fluids to prevent constipation.  If you do become constipated you may want to take a mild laxative or take ducolax tablets on a daily basis until your bowel habits are regular.  Constipation can be very uncomfortable, along with straining, after recent abdominal surgery.  ACTIVITY:  You are encouraged to cough and deep breath or use your incentive spirometer if you were given one, every 15-30 minutes when awake.  This will help prevent respiratory complications and low grade fevers post-operatively.  You may want to hug a pillow when coughing and sneezing to add additional support to the surgical area which will decrease pain during these times.  You are encouraged to walk and engage in light activity for the next two  weeks.  You should not lift more than 20 pounds during this time frame as it could put you at increased risk for a hernia recurrence.  Twenty pounds is roughly equivalent to a plastic bag of groceries.    MEDICATIONS:  Try to take narcotic medications and anti-inflammatory medications, such as tylenol, ibuprofen, naprosyn, etc., with food.  This will minimize stomach upset from the medication.  Should you develop nausea and vomiting from the pain medication, or develop a rash, please discontinue the medication and contact your physician.  You should not drive, make important decisions, or operate machinery when taking narcotic pain medication.  QUESTIONS:  Please feel free to call your physician or the hospital operator if you have any questions, and they will be glad to assist you.    AMBULATORY SURGERY  DISCHARGE INSTRUCTIONS   1) The drugs that you were given will stay in your system until tomorrow so for the next 24 hours you should not:  A) Drive an automobile B) Make any legal decisions C) Drink any alcoholic beverage   2) You may resume regular meals tomorrow.  Today it is better to start with liquids and gradually work up to solid foods.  You may eat anything you prefer, but it is better to start with liquids, then soup and crackers, and gradually work up to solid foods.   3) Please notify your doctor immediately if you have any unusual bleeding, trouble  breathing, redness and pain at the surgery site, drainage, fever, or pain not relieved by medication.    4) Additional Instructions:  Please take stool softeners when taking pain medication.    Please contact your physician with any problems or Same Day Surgery at [phone], Monday through Friday 6 am to 4 pm, or [place] at [facility] number at [phone].

## 2017-02-03 NOTE — Transfer of Care (Signed)
Immediate Anesthesia Transfer of Care Note  Patient: Michael Edwards  Procedure(s) Performed: Procedure(s): HERNIA REPAIR INGUINAL ADULT (Left)  Patient Location: PACU  Anesthesia Type:General  Level of Consciousness: sedated  Airway & Oxygen Therapy: Patient connected to face mask oxygen  Post-op Assessment: Post -op Vital signs reviewed and stable  Post vital signs: stable  Last Vitals:  Vitals:   02/03/17 0601 02/03/17 1157  BP: 114/61 114/61  Pulse: 84 82  Resp: 16 11  Temp: 36.4 C 36.6 C    Last Pain:  Vitals:   02/03/17 1157  TempSrc: Temporal         Complications: No apparent anesthesia complications

## 2017-02-03 NOTE — Anesthesia Procedure Notes (Signed)
Procedure Name: LMA Insertion Date/Time: 02/03/2017 10:43 AM Performed by: Hedda Slade Pre-anesthesia Checklist: Patient identified, Patient being monitored, Timeout performed, Emergency Drugs available and Suction available Patient Re-evaluated:Patient Re-evaluated prior to inductionOxygen Delivery Method: Circle system utilized Preoxygenation: Pre-oxygenation with 100% oxygen Intubation Type: IV induction Ventilation: Mask ventilation without difficulty LMA: LMA inserted LMA Size: 4.0 Tube type: Oral Number of attempts: 1 Placement Confirmation: positive ETCO2 and breath sounds checked- equal and bilateral Tube secured with: Tape Dental Injury: Teeth and Oropharynx as per pre-operative assessment

## 2017-02-03 NOTE — Anesthesia Postprocedure Evaluation (Signed)
Anesthesia Post Note  Patient: Michael Edwards  Procedure(s) Performed: Procedure(s) (LRB): HERNIA REPAIR INGUINAL ADULT (Left)  Patient location during evaluation: PACU Anesthesia Type: General Level of consciousness: awake and alert Pain management: pain level controlled Vital Signs Assessment: post-procedure vital signs reviewed and stable Respiratory status: spontaneous breathing, nonlabored ventilation, respiratory function stable and patient connected to nasal cannula oxygen Cardiovascular status: blood pressure returned to baseline and stable Postop Assessment: no signs of nausea or vomiting Anesthetic complications: no     Last Vitals:  Vitals:   02/03/17 1245 02/03/17 1317  BP: 136/67 118/60  Pulse: 93 86  Resp: 16 16  Temp: 36.3 C     Last Pain:  Vitals:   02/03/17 1317  TempSrc:   PainSc: 0-No pain                 Precious Haws Toluwani Yadav

## 2017-02-03 NOTE — Anesthesia Post-op Follow-up Note (Cosign Needed)
Anesthesia QCDR form completed.        

## 2017-02-03 NOTE — Interval H&P Note (Signed)
History and Physical Interval Note:  02/03/2017 6:56 AM  Michael Edwards  has presented today for surgery, with the diagnosis of left inguinal hernia  The various methods of treatment have been discussed with the patient and family. After consideration of risks, benefits and other options for treatment, the patient has consented to  Procedure(s): HERNIA REPAIR INGUINAL ADULT (Left) as a surgical intervention .  The patient's history has been reviewed, patient examined, no change in status, stable for surgery.  I have reviewed the patient's chart and labs.  Questions were answered to the patient's satisfaction.     Clayburn Pert

## 2017-02-03 NOTE — H&P (View-Only) (Signed)
Patient ID: Michael Edwards, male   DOB: 01-26-1946, 71 y.o.   MRN: [id number]  CC: Left groin bulge.  HPI Michael Edwards is a 71 y.o. male presents to clinic today for evaluation of a left groin bulge. He is on sure how long it has been there but states for at least last couple years he has noticed an occasional bulge that requires manual reduction. He states his never been stuck out. He only occasionally causes a twinge of pain primarily when is being reduced. He notices it stick out mostly when he goes from sitting to standing. He does not recall any trauma or any difficulties with bowel or bladder function. He denies any fevers, chills, nausea, vomiting, chest pain, shortness of breath, diarrhea, constipation.  HPI  Past Medical History:  Diagnosis Date  . Cardiac murmur 08/19/2015  . ED (erectile dysfunction)   . Elevated PSA    10.6 on 07/2015  . Enlarged prostate   . Erectile dysfunction due to arterial insufficiency 07/27/2016  . Incomplete bladder emptying 07/27/2016  . Left shoulder pain   . MI (myocardial infarction)   . Myocardial infarction 08/19/2015    Past Surgical History:  Procedure Laterality Date  . blood clot removed from left upper leg    . VASECTOMY      Family History  Problem Relation Age of Onset  . Hematuria Mother   . Diabetes Sister   . Heart failure Sister     Social History Social History  Substance Use Topics  . Smoking status: Current Every Day Smoker    Packs/day: 1.50  . Smokeless tobacco: Never Used  . Alcohol use Yes     Comment: 6 beers a week    No Known Allergies  Current Outpatient Prescriptions  Medication Sig Dispense Refill  . finasteride (PROSCAR) 5 MG tablet Take 1 tablet (5 mg total) by mouth daily. 30 tablet 3  . lovastatin (MEVACOR) 20 MG tablet     . Multiple Vitamin (MULTIVITAMIN) tablet Take 1 tablet by mouth daily.    . tadalafil (CIALIS) 20 MG tablet Take 1 tablet (20 mg total) by mouth daily as needed for  erectile dysfunction. 18 tablet 0  . tamsulosin (FLOMAX) 0.4 MG CAPS capsule      No current facility-administered medications for this visit.      Review of Systems A Multi-point review of systems was asked and was negative except for the findings documented in the history of present illness  Physical Exam Blood pressure (!) 143/87, pulse 97, temperature 98 F (36.7 C), temperature source Oral, height 5\' 3"  (1.6 m), weight 64.6 kg (142 lb 6.4 oz). CONSTITUTIONAL: No acute distress. EYES: Pupils are equal, round, and reactive to light, Sclera are non-icteric. EARS, NOSE, MOUTH AND THROAT: The oropharynx is clear. The oral mucosa is pink and moist. Hearing is intact to voice. LYMPH NODES:  Lymph nodes in the neck are normal. RESPIRATORY:  Lungs are clear. There is normal respiratory effort, with equal breath sounds bilaterally, and without pathologic use of accessory muscles. CARDIOVASCULAR: Heart is regular but with a pansystolic murmurs, gallops, or rubs. GI: The abdomen is soft, nontender, and nondistended. There is a visible left groin bulge that is easily reducible there is no evidence of any right groin pathology. There is no hepatosplenomegaly. There are normal bowel sounds in all quadrants. GU: Rectal deferred.   MUSCULOSKELETAL: Normal muscle strength and tone. No cyanosis or edema.   SKIN: Turgor is good and there  are no pathologic skin lesions or ulcers. NEUROLOGIC: Motor and sensation is grossly normal. Cranial nerves are grossly intact. PSYCH:  Oriented to person, place and time. Affect is normal.  Data Reviewed No recent images and labs to review I have personally reviewed the patient's imaging, laboratory findings and medical records.    Assessment    Left inguinal hernia    Plan    71 year old male with a symptomatic left inguinal hernia. Discussed the diagnosis at length as well as the treatment options of open versus laparoscopic hernia repair. Both repairs were  described in detail and after this discussion patient elects to have an open inguinal hernia repair. Discussed the primary risks in detail to include pain, bleeding, infection, damage to surrounding structures including nerves or testicular vessels. Discussed that this could possibly lead to loss of the testicle on that side. He voiced understanding and desires to proceed. Plan for an elective open left inguinal hernia repair on Thursday, April 5.     Time spent with the patient was 45 minutes, with more than 50% of the time spent in face-to-face education, counseling and care coordination.     Michael Pert, MD FACS General Surgeon 01/17/2017, 3:05 PM

## 2017-02-03 NOTE — Anesthesia Preprocedure Evaluation (Addendum)
Anesthesia Evaluation  Patient identified by MRN, date of birth, ID band Patient awake    Reviewed: Allergy & Precautions, NPO status , Patient's Chart, lab work & pertinent test results, reviewed documented beta blocker date and time   Airway Mallampati: II  TM Distance: >3 FB     Dental  (+) Upper Dentures, Lower Dentures   Pulmonary shortness of breath, Current Smoker,           Cardiovascular + Past MI       Neuro/Psych    GI/Hepatic   Endo/Other    Renal/GU      Musculoskeletal   Abdominal   Peds  Hematology   Anesthesia Other Findings   Reproductive/Obstetrics                            Anesthesia Physical Anesthesia Plan  ASA: III  Anesthesia Plan: General   Post-op Pain Management:    Induction: Intravenous  Airway Management Planned: LMA  Additional Equipment:   Intra-op Plan:   Post-operative Plan:   Informed Consent: I have reviewed the patients History and Physical, chart, labs and discussed the procedure including the risks, benefits and alternatives for the proposed anesthesia with the patient or authorized representative who has indicated his/her understanding and acceptance.     Plan Discussed with: CRNA  Anesthesia Plan Comments:         Anesthesia Quick Evaluation

## 2017-02-03 NOTE — Op Note (Signed)
Pre-operative Diagnosis: Left inguinal hernia  Post-operative Diagnosis: Direct left inguinal hernia  Procedure performed: Open left inguinal hernia repair  Surgeon: Clayburn Pert   Assistants: None  Anesthesia: General LMA anesthesia  ASA Class: 2  Surgeon: Clayburn Pert, MD FACS  Anesthesia: Gen. with endotracheal tube  Assistant: None  Procedure Details  The patient was seen again in the Holding Room. The benefits, complications, treatment options, and expected outcomes were discussed with the patient. The risks of bleeding, infection, recurrence of symptoms, failure to resolve symptoms,  bowel injury, any of which could require further surgery were reviewed with the patient.   The patient was taken to Operating Room, identified as Michael Edwards and the procedure verified.  A Time Out was held and the above information confirmed.  Prior to the induction of general anesthesia, antibiotic prophylaxis was administered. VTE prophylaxis was in place. General endotracheal anesthesia was then administered and tolerated well. After the induction, the abdomen was prepped with Chloraprep and draped in the sterile fashion. The patient was positioned in the supine position with the left arm tucked.  The left-sided ASIS and pubic tubercle were again marked and the incision site was planned. The area was localized with a 1% lidocaine solution and incised with a 10 blade scalpel. Using Bovie electrode cautery was taken down to the level of external oblique fascia. The fascia was then sharply entered into with a 15 blade scalpel and using the small scissors and opened up in the direction of the fibers to the external ring. This allowed the spermatic cord to be isolated and elevated with a Penrose drain.  The cord was then skeletonized and a cord lipoma was identified. No indirect inguinal hernia was identified at this point. The direct space was then inspected and a direct hernia was  identified. At this point the decision was made to do a suture repair of the direct space prior to reinforcing the entire area with mesh. An appropriate space for the mesh was created circumferentially with electrocautery and blunt dissection. A relaxing incision was made along the medial aspect of this revealing the rectus muscle. The hernia was reduced and using 0 Ethibond sutures the direct space was reapproximated in interrupted fashion.  With the direct space reapproximated a left-sided progrip mesh was brought up to the field. It was cut to the appropriate size and placed around the spermatic cord where it laid flat covering both the direct and indirect spaces. It was then secured to the pubic tubercle and the conjoined tendon with interrupted 2-0 Vicryl suture. Entire area was copiously irrigated and meticulous hemostasis was insured. Liposomal bupivacaine was injected into the entire field creating a field block.  The external oblique fascia was then closed with a running 2-0 Vicryl suture. Scarpa's fascia and the deep dermal tissues were reapproximated with interrupted 3-0 Vicryl suture. The skin was closed with a running subcuticular 4-0 Monocryl and then sealed with Dermabond. Bilateral testicles were confirmed within the scrotum prior to the patient awakening from general anesthesia. The patient tolerated the procedure well, was awoken from anesthesia and transferred to the PACU in good condition. There were no immediate complications and all counts were correct.  Findings: Direct left inguinal hernia   Estimated Blood Loss: 5 mL         Drains: None         Specimens: None          Complications: None  Condition: Good   Clayburn Pert, MD, FACS

## 2017-02-10 ENCOUNTER — Encounter: Payer: Self-pay | Admitting: General Surgery

## 2017-02-10 ENCOUNTER — Ambulatory Visit (INDEPENDENT_AMBULATORY_CARE_PROVIDER_SITE_OTHER): Payer: Medicare Other | Admitting: General Surgery

## 2017-02-10 VITALS — BP 124/70 | HR 101 | Temp 97.8°F | Ht 65.0 in | Wt 144.0 lb

## 2017-02-10 DIAGNOSIS — Z4889 Encounter for other specified surgical aftercare: Secondary | ICD-10-CM

## 2017-02-10 NOTE — Progress Notes (Signed)
Outpatient Surgical Follow Up  02/10/2017  Michael Edwards is an 71 y.o. male.   Chief Complaint  Patient presents with  . Routine Post Op    Left Inguinal Hernia Repair (02/03/17)- Dr. Adonis Huguenin    HPI: 71 year old male returns to clinic 1 week status post open left inguinal hernia repair. Patient forced very well. He has not taken any pain medications. He is eating well and having normal bowel function. Patient denies any fevers, chills, nausea, vomiting, chest pain, shortness of breath, diarrhea, constipation.  Past Medical History:  Diagnosis Date  . Cardiac murmur 08/19/2015  . Dyspnea   . ED (erectile dysfunction)   . Elevated PSA    10.6 on 07/2015  . Enlarged prostate   . Erectile dysfunction due to arterial insufficiency 07/27/2016  . Incomplete bladder emptying 07/27/2016  . Left shoulder pain   . MI (myocardial infarction)   . Myocardial infarction 08/19/2015    Past Surgical History:  Procedure Laterality Date  . ANKLE FRACTURE SURGERY Left   . blood clot removed from left upper leg    . INGUINAL HERNIA REPAIR Left 02/03/2017   Procedure: HERNIA REPAIR INGUINAL ADULT;  Surgeon: Clayburn Pert, MD;  Location: [facility] ORS;  Service: General;  Laterality: Left;  Marland Kitchen VASECTOMY      Family History  Problem Relation Age of Onset  . Hematuria Mother   . Diabetes Sister   . Heart failure Sister     Social History:  reports that he has been smoking.  He has a 82.50 pack-year smoking history. He has never used smokeless tobacco. He reports that he drinks alcohol. He reports that he does not use drugs.  Allergies: No Known Allergies  Medications reviewed.    ROS A multipoint review of systems was completed, all pertinent positives and negatives are documented in the history of present illness and remainder are negative.   BP 124/70   Pulse (!) 101   Temp 97.8 F (36.6 C) (Oral)   Ht 5\' 5"  (1.651 m)   Wt 65.3 kg (144 lb)   BMI 23.96 kg/m   Physical Exam Gen.: No  acute distress Chest: Clear to auscultation Heart: Regular rhythm Abdomen: Soft, nontender, nondistended. Well approximated left inguinal hernia incision with Dermabond still in place. Normal healing ridge. No signs of recurrence or infection.    No results found for this or any previous visit (from the past 48 hour(s)). No results found.  Assessment/Plan:  1. Aftercare following surgery 71 year old male 1 week status post open left inguinal hernia. Doing very well. Reiterated at length the standard postoperative precautions. Patient voiced understanding. He'll follow-up in clinic on an as-needed basis for any signs of infection or recurrence. All questions answered to his satisfaction.     Clayburn Pert, MD FACS General Surgeon  02/10/2017,9:47 AM

## 2017-02-10 NOTE — Patient Instructions (Signed)
Please call our office with any questions or concerns.  Please do not submerge in a tub, hot tub, or pool until incisions are completely sealed.  Use sun block to incision area over the next year if this area will be exposed to sun. This helps decrease scarring.  You may resume your normal activities on 03/17/17. At that time- Listen to your body when lifting, if you have pain when lifting, stop and then try again in a few days. Pain after doing exercises or activities of daily living is normal as you get back in to your normal routine.  If you develop redness, drainage, or pain at incision sites- call our office immediately and speak with a nurse.

## 2017-04-12 DIAGNOSIS — E785 Hyperlipidemia, unspecified: Secondary | ICD-10-CM | POA: Diagnosis not present

## 2017-04-12 DIAGNOSIS — N400 Enlarged prostate without lower urinary tract symptoms: Secondary | ICD-10-CM | POA: Diagnosis not present

## 2017-04-12 DIAGNOSIS — J449 Chronic obstructive pulmonary disease, unspecified: Secondary | ICD-10-CM | POA: Diagnosis not present

## 2017-07-18 ENCOUNTER — Other Ambulatory Visit: Payer: Medicare Other

## 2017-07-25 ENCOUNTER — Ambulatory Visit: Payer: Medicare Other

## 2017-07-26 ENCOUNTER — Telehealth: Payer: Self-pay

## 2017-07-26 NOTE — Telephone Encounter (Signed)
Pt called stating he feels like he has a UTI but does not have an appt unitl October. Pt described s/s to be straining to urinate and severe dysuria. Pt was offered a nurse visit for today. Pt declined. Pt was added to nurse schedule for tomorrow.

## 2017-07-27 ENCOUNTER — Ambulatory Visit (INDEPENDENT_AMBULATORY_CARE_PROVIDER_SITE_OTHER): Payer: Medicare Other

## 2017-07-27 ENCOUNTER — Telehealth: Payer: Self-pay | Admitting: Urology

## 2017-07-27 ENCOUNTER — Other Ambulatory Visit: Payer: Self-pay | Admitting: Urology

## 2017-07-27 VITALS — BP 131/64 | HR 73 | Ht 65.0 in | Wt 130.0 lb

## 2017-07-27 DIAGNOSIS — N390 Urinary tract infection, site not specified: Secondary | ICD-10-CM

## 2017-07-27 LAB — MICROSCOPIC EXAMINATION
Epithelial Cells (non renal): NONE SEEN /HPF
RBC, UA: NONE SEEN /HPF
WBC, UA: >30 /HPF — ABNORMAL HIGH

## 2017-07-27 LAB — URINALYSIS, COMPLETE
Bilirubin, UA: NEGATIVE
Glucose, UA: NEGATIVE
Ketones, UA: NEGATIVE
Nitrite, UA: POSITIVE — AB
PH UA: 7 (ref 5.0–7.5)
Specific Gravity, UA: 1.015 (ref 1.005–1.030)
UUROB: 0.2 mg/dL (ref 0.2–1.0)

## 2017-07-27 MED ORDER — AMOXICILLIN-POT CLAVULANATE 875-125 MG PO TABS: 1.0000 | ORAL_TABLET | Freq: Two times a day (BID) | ORAL | 0 refills | Status: DC

## 2017-07-27 NOTE — Progress Notes (Signed)
Augmentin 875/125 sent to pharmacy.  May need to switch antibiotics once culture results are available.

## 2017-07-27 NOTE — Progress Notes (Signed)
Pt presents today with c/o urinary frequency and urgency, hard to postpone urination, dysuria, and leakage of urine. Pt does CIC and provided a cath specimen. Urine was sent for u/a and cx.  Blood pressure 131/64, pulse 73, height 5\' 5"  (1.651 m), weight 130 lb (59 kg).

## 2017-07-31 ENCOUNTER — Other Ambulatory Visit: Payer: Self-pay | Admitting: Urology

## 2017-07-31 LAB — CULTURE, URINE COMPREHENSIVE

## 2017-07-31 MED ORDER — SULFAMETHOXAZOLE-TRIMETHOPRIM 800-160 MG PO TABS: 1.0000 | ORAL_TABLET | Freq: Two times a day (BID) | ORAL | 0 refills | Status: DC

## 2017-07-31 NOTE — Progress Notes (Signed)
I have sent Septra DS to his pharmacy in [place].

## 2017-08-01 ENCOUNTER — Telehealth: Payer: Self-pay

## 2017-08-01 NOTE — Telephone Encounter (Signed)
Spoke with pt in reference to picking up abx yesterday. Pt stated that he did and has started it.

## 2017-08-01 NOTE — Telephone Encounter (Signed)
-----   Message from Nori Riis, PA-C sent at 07/31/2017  2:22 PM EDT ----- Antibiotic sent to [facility] in [place].

## 2017-08-12 ENCOUNTER — Other Ambulatory Visit: Payer: Medicare Other

## 2017-08-12 DIAGNOSIS — R339 Retention of urine, unspecified: Secondary | ICD-10-CM

## 2017-08-14 LAB — BASIC METABOLIC PANEL
BUN/Creatinine Ratio: 16 (ref 10–24)
BUN: 16 mg/dL (ref 8–27)
CALCIUM: 9.3 mg/dL (ref 8.6–10.2)
CHLORIDE: 101 mmol/L (ref 96–106)
CO2: 24 mmol/L (ref 20–29)
Creatinine, Ser: 0.98 mg/dL (ref 0.76–1.27)
GFR calc Af Amer: 88 mL/min/{1.73_m2} (ref >59)
GFR calc non Af Amer: 76 mL/min/{1.73_m2} (ref >59)
GLUCOSE: 114 mg/dL — AB (ref 65–99)
POTASSIUM: 4.3 mmol/L (ref 3.5–5.2)
SODIUM: 142 mmol/L (ref 134–144)

## 2017-08-18 ENCOUNTER — Encounter: Payer: Self-pay | Admitting: Urology

## 2017-08-18 ENCOUNTER — Ambulatory Visit (INDEPENDENT_AMBULATORY_CARE_PROVIDER_SITE_OTHER): Payer: Medicare Other | Admitting: Urology

## 2017-08-18 VITALS — BP 110/51 | HR 73 | Ht 65.0 in | Wt 131.4 lb

## 2017-08-18 DIAGNOSIS — R338 Other retention of urine: Secondary | ICD-10-CM | POA: Diagnosis not present

## 2017-08-18 DIAGNOSIS — N529 Male erectile dysfunction, unspecified: Secondary | ICD-10-CM

## 2017-08-18 DIAGNOSIS — N401 Enlarged prostate with lower urinary tract symptoms: Secondary | ICD-10-CM | POA: Diagnosis not present

## 2017-08-18 NOTE — Progress Notes (Signed)
08/18/2017 9:09 AM   [name] July 14, 1946 [id number]  Referring provider: McLean-Scocuzza, N[address], [place], [place] [address]  Chief Complaint  Patient presents with  . Benign Prostatic Hypertrophy    HPI: 1- BPH with lower urinary tract symptoms, incomplete bladder emptying -significant lower urinary tract symptoms with obstructive symptoms x years. Placed on max medical therapy with tamsulosin + finasteride but PVR"s still 526mL plus. He is very facile with self cath BID-TID and is quite happy with this as adjunct to spontaneous voiding. He has known trilobar hypertrophy and is now interested in surgery.  BMP 10/18 with normal renal function  2-elevated PSA- 07/2015 - 10.9  ---> NEGATIVE prostate biopsy 12/2015, 26mL with large median lobe. At age 71.  3-erectile dysfunction - He reportedly had been taking Cialis 20 mg as needed for erectile issues but has not taken it for some time due to cost.   Today "Michael Edwards" is seen in f/u above. No interval febrile UTI. He remains very satisfied on self-cath.      PMH: Past Medical History:  Diagnosis Date  . Cardiac murmur 08/19/2015  . Dyspnea   . ED (erectile dysfunction)   . Elevated PSA    10.6 on 07/2015  . Enlarged prostate   . Erectile dysfunction due to arterial insufficiency 07/27/2016  . Incomplete bladder emptying 07/27/2016  . Left shoulder pain   . MI (myocardial infarction) ([place])   . Myocardial infarction [facility]) 08/19/2015    Surgical History: Past Surgical History:  Procedure Laterality Date  . ANKLE FRACTURE SURGERY Left   . blood clot removed from left upper leg    . INGUINAL HERNIA REPAIR Left 02/03/2017   Procedure: HERNIA REPAIR INGUINAL ADULT;  Surgeon: Clayburn Pert, MD;  Location: [facility] ORS;  Service: General;  Laterality: Left;  Marland Kitchen VASECTOMY      Home Medications:  Allergies as of 08/18/2017   No Known Allergies     Medication List       Accurate as of 08/18/17  9:09  AM. Always use your most recent med list.          aspirin EC 81 MG tablet Take 81 mg by mouth daily.   lovastatin 20 MG tablet Commonly known as:  MEVACOR Take 20 mg by mouth daily.   multivitamin tablet Take 1 tablet by mouth daily.   tadalafil 20 MG tablet Commonly known as:  CIALIS Take 1 tablet (20 mg total) by mouth daily as needed for erectile dysfunction.   tamsulosin 0.4 MG Caps capsule Commonly known as:  FLOMAX Take 0.4 mg by mouth daily after breakfast.       Allergies: No Known Allergies  Family History: Family History  Problem Relation Age of Onset  . Hematuria Mother   . Diabetes Sister   . Heart failure Sister     Social History:  reports that he has been smoking.  He has a 82.50 pack-year smoking history. He has never used smokeless tobacco. He reports that he drinks alcohol. He reports that he does not use drugs.  ROS: UROLOGY Frequent Urination?: No Hard to postpone urination?: Yes Burning/pain with urination?: Yes Get up at night to urinate?: Yes Leakage of urine?: No Urine stream starts and stops?: No Trouble starting stream?: No Do you have to strain to urinate?: No Blood in urine?: No Urinary tract infection?: No Sexually transmitted disease?: No Injury to kidneys or bladder?: No Painful intercourse?: No Weak stream?: No Erection problems?: Yes  Penile pain?: No  Gastrointestinal Nausea?: No Vomiting?: No Indigestion/heartburn?: No Diarrhea?: No Constipation?: No  Constitutional Fever: No Night sweats?: No Weight loss?: No Fatigue?: No  Skin Skin rash/lesions?: No Itching?: No  Eyes Blurred vision?: No Double vision?: No  Ears/Nose/Throat Sore throat?: No Sinus problems?: No  Hematologic/Lymphatic Swollen glands?: No Easy bruising?: No  Cardiovascular Leg swelling?: No Chest pain?: No  Respiratory Cough?: No Shortness of breath?: No  Endocrine Excessive thirst?: No  Musculoskeletal Back pain?:  No Joint pain?: No  Neurological Headaches?: No Dizziness?: No  Psychologic Depression?: No Anxiety?: No  Physical Exam: BP (!) 110/51 (BP Location: Right Arm, Patient Position: Sitting, Cuff Size: Normal)   Pulse 73   Ht 5\' 5"  (1.651 m)   Wt 131 lb 6.4 oz (59.6 kg)   BMI 21.87 kg/m   Constitutional:  Alert and oriented, No acute distress. HEENT: [place] AT, moist mucus membranes.  Trachea midline, no masses. Cardiovascular: No clubbing, cyanosis, or edema. Respiratory: Normal respiratory effort, no increased work of breathing. GI: Abdomen is soft, nontender, nondistended, no abdominal masses GU: No CVA tenderness.  Skin: No rashes, bruises or suspicious lesions. Lymph: No cervical or inguinal adenopathy. Neurologic: Grossly intact, no focal deficits, moving all 4 extremities. Psychiatric: Normal mood and affect.  Laboratory Data: Lab Results  Component Value Date   WBC 11.3 (H) 01/27/2017   HGB 15.4 01/27/2017   HCT 45.3 01/27/2017   MCV 90.2 01/27/2017   PLT 254 01/27/2017    Lab Results  Component Value Date   CREATININE 0.98 08/12/2017    Lab Results  Component Value Date   PSA 10.6 07/14/2015    No results found for: TESTOSTERONE  No results found for: HGBA1C  Urinalysis    Component Value Date/Time   APPEARANCEUR Cloudy (A) 07/27/2017 1020   GLUCOSEU Negative 07/27/2017 1020   BILIRUBINUR Negative 07/27/2017 1020   PROTEINUR Trace (A) 07/27/2017 1020   NITRITE Positive (A) 07/27/2017 1020   LEUKOCYTESUR 2+ (A) 07/27/2017 1020    Assessment & Plan:   1- BPH with lower urinary tract symptoms, incomplete bladder emptying -Discussed continued self cath v.   proceed with UDS and then possible TURP if bladder contractions preserved.  He is currently interested. We'll plan for UDS and follow-up to discuss TURP.  Rec yearly surveillance with exam and BMP and he is amenable.   2-elevated PSA- negative BX over age 36, no role for further screening.    3-erectile dysfunction - continiue prn PDE5i.  Return for after urodynamic studies.  Nickie Retort, MD  [facility] [address] [place], [place] [address] [phone]

## 2017-09-06 ENCOUNTER — Telehealth: Payer: Self-pay | Admitting: Urology

## 2017-09-06 NOTE — Telephone Encounter (Signed)
Pt called to cancel appt, due to financial issues.  Just F.Y.I.

## 2017-09-08 ENCOUNTER — Ambulatory Visit: Payer: Medicare Other

## 2017-10-10 NOTE — Telephone Encounter (Signed)
Error

## 2017-10-18 DIAGNOSIS — E782 Mixed hyperlipidemia: Secondary | ICD-10-CM | POA: Diagnosis not present

## 2017-10-18 DIAGNOSIS — I350 Nonrheumatic aortic (valve) stenosis: Secondary | ICD-10-CM | POA: Diagnosis not present

## 2017-10-18 DIAGNOSIS — J449 Chronic obstructive pulmonary disease, unspecified: Secondary | ICD-10-CM | POA: Diagnosis not present

## 2018-02-02 ENCOUNTER — Telehealth: Payer: Self-pay

## 2018-02-02 NOTE — Telephone Encounter (Signed)
Pt cath three times a day.

## 2018-04-18 DIAGNOSIS — J449 Chronic obstructive pulmonary disease, unspecified: Secondary | ICD-10-CM | POA: Diagnosis not present

## 2018-04-18 DIAGNOSIS — E782 Mixed hyperlipidemia: Secondary | ICD-10-CM | POA: Diagnosis not present

## 2018-04-18 DIAGNOSIS — N400 Enlarged prostate without lower urinary tract symptoms: Secondary | ICD-10-CM | POA: Diagnosis not present

## 2018-04-18 DIAGNOSIS — H6123 Impacted cerumen, bilateral: Secondary | ICD-10-CM | POA: Diagnosis not present

## 2019-01-04 ENCOUNTER — Ambulatory Visit (INDEPENDENT_AMBULATORY_CARE_PROVIDER_SITE_OTHER): Payer: Medicare Other | Admitting: Urology

## 2019-01-04 ENCOUNTER — Encounter: Payer: Self-pay | Admitting: Urology

## 2019-01-04 VITALS — BP 163/67 | HR 76 | Ht 65.0 in | Wt 126.0 lb

## 2019-01-04 DIAGNOSIS — R972 Elevated prostate specific antigen [PSA]: Secondary | ICD-10-CM | POA: Diagnosis not present

## 2019-01-04 DIAGNOSIS — N529 Male erectile dysfunction, unspecified: Secondary | ICD-10-CM | POA: Diagnosis not present

## 2019-01-04 DIAGNOSIS — R339 Retention of urine, unspecified: Secondary | ICD-10-CM | POA: Diagnosis not present

## 2019-01-04 DIAGNOSIS — N401 Enlarged prostate with lower urinary tract symptoms: Secondary | ICD-10-CM | POA: Diagnosis not present

## 2019-01-04 DIAGNOSIS — R338 Other retention of urine: Secondary | ICD-10-CM | POA: Diagnosis not present

## 2019-01-04 DIAGNOSIS — N138 Other obstructive and reflux uropathy: Secondary | ICD-10-CM

## 2019-01-04 LAB — BLADDER SCAN AMB NON-IMAGING

## 2019-01-04 MED ORDER — TAMSULOSIN HCL 0.4 MG PO CAPS: 0.4000 mg | ORAL_CAPSULE | Freq: Every day | ORAL | 3 refills | Status: DC

## 2019-01-04 MED ORDER — TADALAFIL 20 MG PO TABS: 20.0000 mg | ORAL_TABLET | Freq: Every day | ORAL | 11 refills | Status: DC | PRN

## 2019-01-04 NOTE — Progress Notes (Signed)
01/04/2019 11:20 AM   [name] [name] [birth year] [id number]  Referring provider: McLean-Scocuzza, Nino Glow, MD [place], [place] [address]  Chief Complaint  Patient presents with  . Benign Prostatic Hypertrophy    Follow up    HPI: 73 yo M who returns today for routine annual follow-up.  He was last seen on 08/2017.  BPH with incomplete bladder emptying Self cath tid He reports today that he needs refills on his catheter supplies No issues or difficulty with self cath He does not want to have prostate surgery due to concern for cost He does take flomax and finasteride Nocturia x 3.  ED He uses cialis as needed, 20 mg.  He only uses this medication when he is able to afford it.  Elevated PSA Personal history of elevated PSA to 10.9 in 07/2015.  Underwent negative prostate biopsy in 12/2012 at which time his prostate measured 41 mL's.  Large median lobe was noted.  No recent PSAs.   PMH: Past Medical History:  Diagnosis Date  . Cardiac murmur 08/19/2015  . Dyspnea   . ED (erectile dysfunction)   . Elevated PSA    10.6 on 07/2015  . Enlarged prostate   . Erectile dysfunction due to arterial insufficiency 07/27/2016  . Incomplete bladder emptying 07/27/2016  . Left shoulder pain   . MI (myocardial infarction) ([place])   . Myocardial infarction [facility]) 08/19/2015    Surgical History: Past Surgical History:  Procedure Laterality Date  . ANKLE FRACTURE SURGERY Left   . blood clot removed from left upper leg    . INGUINAL HERNIA REPAIR Left 02/03/2017   Procedure: HERNIA REPAIR INGUINAL ADULT;  Surgeon: Clayburn Pert, MD;  Location: [facility] ORS;  Service: General;  Laterality: Left;  Marland Kitchen VASECTOMY      Home Medications:  Allergies as of 01/04/2019   No Known Allergies     Medication List       Accurate as of January 04, 2019 11:59 PM. Always use your most recent med list.        aspirin EC 81 MG tablet Take 81 mg by mouth daily.   lovastatin 20 MG  tablet Commonly known as:  MEVACOR Take 20 mg by mouth daily.   multivitamin tablet Take 1 tablet by mouth daily.   tadalafil 20 MG tablet Commonly known as:  ADCIRCA/CIALIS Take 1 tablet (20 mg total) by mouth daily as needed for erectile dysfunction.   tamsulosin 0.4 MG Caps capsule Commonly known as:  FLOMAX Take 1 capsule (0.4 mg total) by mouth daily after breakfast.       Allergies: No Known Allergies  Family History: Family History  Problem Relation Age of Onset  . Hematuria Mother   . Diabetes Sister   . Heart failure Sister     Social History:  reports that he has been smoking. He has a 82.50 pack-year smoking history. He has never used smokeless tobacco. He reports current alcohol use. He reports that he does not use drugs.  ROS: UROLOGY Frequent Urination?: Yes Hard to postpone urination?: Yes Burning/pain with urination?: No Get up at night to urinate?: Yes Leakage of urine?: Yes Urine stream starts and stops?: No Trouble starting stream?: No Do you have to strain to urinate?: No Blood in urine?: No Urinary tract infection?: No Sexually transmitted disease?: No Injury to kidneys or bladder?: No Painful intercourse?: No Weak stream?: No Erection problems?: Yes Penile pain?: No  Gastrointestinal Nausea?: No Vomiting?: No Indigestion/heartburn?: No  Diarrhea?: No Constipation?: No  Constitutional Fever: No Night sweats?: No Weight loss?: No Fatigue?: No  Skin Skin rash/lesions?: No Itching?: No  Eyes Blurred vision?: No Double vision?: No  Ears/Nose/Throat Sore throat?: No Sinus problems?: Yes  Hematologic/Lymphatic Swollen glands?: No Easy bruising?: No  Cardiovascular Leg swelling?: No Chest pain?: No  Respiratory Cough?: No Shortness of breath?: No  Endocrine Excessive thirst?: No  Musculoskeletal Back pain?: No Joint pain?: No  Neurological Headaches?: No Dizziness?: No  Psychologic Depression?:  No Anxiety?: No  Physical Exam: BP (!) 163/67   Pulse 76   Ht 5\' 5"  (1.651 m)   Wt 126 lb (57.2 kg)   BMI 20.97 kg/m   Constitutional:  Alert and oriented, No acute distress. HEENT: [place] AT, moist mucus membranes.  Trachea midline, no masses. Cardiovascular: No clubbing, cyanosis, or edema. Respiratory: Normal respiratory effort, no increased work of breathing. GI: Abdomen is soft, nontender, nondistended, no abdominal masses GU: No CVA tenderness Rectal, deferred secondary to age and comorbidities Skin: No rashes, bruises or suspicious lesions. Neurologic: Grossly intact, no focal deficits, moving all 4 extremities. Psychiatric: Normal mood and affect.  Laboratory Data: Lab Results  Component Value Date   WBC 17.0 (H) 01/10/2019   HGB 13.6 01/10/2019   HCT 40.2 01/10/2019   MCV 92.2 01/10/2019   PLT 223 01/10/2019    Lab Results  Component Value Date   CREATININE 1.21 01/10/2019    Pertinent Imaging: Results for orders placed or performed in visit on 01/04/19  PSA  Result Value Ref Range   Prostate Specific Ag, Serum 9.1 (H) 0.0 - 4.0 ng/mL  BLADDER SCAN AMB NON-IMAGING  Result Value Ref Range   Scan Result 33ml     Assessment & Plan:   1. Benign prostatic hyperplasia with urinary obstruction Discussed surgical intervention again today, the patient is not interested in this and is happy with self cath as needed 2-3 times a day Patient supply company will contact us for refills Patient does have minimal PVR today after voiding spontaneously which is reassuring that he does empty at least part of the time Continue Flomax and finasteride, prescription refilled - BLADDER SCAN AMB NON-IMAGING - PSA  2. Incomplete bladder emptying As above  3. Elevated PSA Personal history of elevated PSA status post negative biopsy in 2016 PSA today is stable, given age and comorbidities would recommend continue to follow PSA but no aggressive intervention as his PSA remains  stable on finasteride  4. Erectile dysfunction, unspecified erectile dysfunction type Cialis as needed Discussed option of sildenafil which may be more economically feasible for the patient, declined and would like to continue Cialis which was also refilled today   Return in about 1 year (around 01/04/2020) for with shannon annual f/u.  Hollice Espy, MD  [facility] [address], [place], [place] [address] [phone]

## 2019-01-05 LAB — PSA: Prostate Specific Ag, Serum: 9.1 ng/mL — ABNORMAL HIGH (ref 0.0–4.0)

## 2019-01-09 ENCOUNTER — Other Ambulatory Visit: Payer: Self-pay

## 2019-01-09 ENCOUNTER — Emergency Department (HOSPITAL_COMMUNITY): Payer: No Typology Code available for payment source

## 2019-01-09 ENCOUNTER — Inpatient Hospital Stay (HOSPITAL_COMMUNITY)
Admission: EM | Admit: 2019-01-09 | Discharge: 2019-01-12 | DRG: 287 | Disposition: A | Discharge status: Home / Self Care | Payer: No Typology Code available for payment source | Attending: Internal Medicine | Admitting: Internal Medicine

## 2019-01-09 DIAGNOSIS — I700 Atherosclerosis of aorta: Secondary | ICD-10-CM | POA: Diagnosis present

## 2019-01-09 DIAGNOSIS — S3993XA Unspecified injury of pelvis, initial encounter: Secondary | ICD-10-CM | POA: Diagnosis not present

## 2019-01-09 DIAGNOSIS — J432 Centrilobular emphysema: Secondary | ICD-10-CM | POA: Diagnosis present

## 2019-01-09 DIAGNOSIS — Z833 Family history of diabetes mellitus: Secondary | ICD-10-CM

## 2019-01-09 DIAGNOSIS — R079 Chest pain, unspecified: Secondary | ICD-10-CM | POA: Diagnosis not present

## 2019-01-09 DIAGNOSIS — S02632D Fracture of coronoid process of left mandible, subsequent encounter for fracture with routine healing: Secondary | ICD-10-CM | POA: Diagnosis not present

## 2019-01-09 DIAGNOSIS — R001 Bradycardia, unspecified: Secondary | ICD-10-CM | POA: Diagnosis not present

## 2019-01-09 DIAGNOSIS — R55 Syncope and collapse: Secondary | ICD-10-CM

## 2019-01-09 DIAGNOSIS — M545 Low back pain: Secondary | ICD-10-CM | POA: Diagnosis present

## 2019-01-09 DIAGNOSIS — R609 Edema, unspecified: Secondary | ICD-10-CM | POA: Diagnosis not present

## 2019-01-09 DIAGNOSIS — S0240FA Zygomatic fracture, left side, initial encounter for closed fracture: Secondary | ICD-10-CM | POA: Diagnosis present

## 2019-01-09 DIAGNOSIS — S0240DA Maxillary fracture, left side, initial encounter for closed fracture: Secondary | ICD-10-CM | POA: Diagnosis present

## 2019-01-09 DIAGNOSIS — M542 Cervicalgia: Secondary | ICD-10-CM | POA: Diagnosis not present

## 2019-01-09 DIAGNOSIS — Z79899 Other long term (current) drug therapy: Secondary | ICD-10-CM

## 2019-01-09 DIAGNOSIS — S199XXA Unspecified injury of neck, initial encounter: Secondary | ICD-10-CM | POA: Diagnosis not present

## 2019-01-09 DIAGNOSIS — S02842A Fracture of lateral orbital wall, left side, initial encounter for closed fracture: Secondary | ICD-10-CM | POA: Diagnosis present

## 2019-01-09 DIAGNOSIS — S0232XA Fracture of orbital floor, left side, initial encounter for closed fracture: Secondary | ICD-10-CM | POA: Diagnosis not present

## 2019-01-09 DIAGNOSIS — S0219XA Other fracture of base of skull, initial encounter for closed fracture: Secondary | ICD-10-CM | POA: Diagnosis not present

## 2019-01-09 DIAGNOSIS — I348 Other nonrheumatic mitral valve disorders: Secondary | ICD-10-CM | POA: Diagnosis present

## 2019-01-09 DIAGNOSIS — G8929 Other chronic pain: Secondary | ICD-10-CM | POA: Diagnosis present

## 2019-01-09 DIAGNOSIS — N529 Male erectile dysfunction, unspecified: Secondary | ICD-10-CM | POA: Diagnosis present

## 2019-01-09 DIAGNOSIS — Z7982 Long term (current) use of aspirin: Secondary | ICD-10-CM

## 2019-01-09 DIAGNOSIS — S0292XA Unspecified fracture of facial bones, initial encounter for closed fracture: Secondary | ICD-10-CM

## 2019-01-09 DIAGNOSIS — S3991XA Unspecified injury of abdomen, initial encounter: Secondary | ICD-10-CM | POA: Diagnosis not present

## 2019-01-09 DIAGNOSIS — I350 Nonrheumatic aortic (valve) stenosis: Principal | ICD-10-CM

## 2019-01-09 DIAGNOSIS — N400 Enlarged prostate without lower urinary tract symptoms: Secondary | ICD-10-CM | POA: Diagnosis present

## 2019-01-09 DIAGNOSIS — I2510 Atherosclerotic heart disease of native coronary artery without angina pectoris: Secondary | ICD-10-CM | POA: Diagnosis present

## 2019-01-09 DIAGNOSIS — Y92410 Unspecified street and highway as the place of occurrence of the external cause: Secondary | ICD-10-CM

## 2019-01-09 DIAGNOSIS — I959 Hypotension, unspecified: Secondary | ICD-10-CM | POA: Diagnosis present

## 2019-01-09 DIAGNOSIS — S299XXA Unspecified injury of thorax, initial encounter: Secondary | ICD-10-CM | POA: Diagnosis not present

## 2019-01-09 DIAGNOSIS — S0511XA Contusion of eyeball and orbital tissues, right eye, initial encounter: Secondary | ICD-10-CM | POA: Diagnosis present

## 2019-01-09 DIAGNOSIS — M79642 Pain in left hand: Secondary | ICD-10-CM

## 2019-01-09 DIAGNOSIS — Z8249 Family history of ischemic heart disease and other diseases of the circulatory system: Secondary | ICD-10-CM

## 2019-01-09 DIAGNOSIS — S2220XA Unspecified fracture of sternum, initial encounter for closed fracture: Secondary | ICD-10-CM | POA: Diagnosis not present

## 2019-01-09 DIAGNOSIS — S060X9A Concussion with loss of consciousness of unspecified duration, initial encounter: Secondary | ICD-10-CM | POA: Diagnosis present

## 2019-01-09 DIAGNOSIS — S02632A Fracture of coronoid process of left mandible, initial encounter for closed fracture: Secondary | ICD-10-CM | POA: Diagnosis present

## 2019-01-09 DIAGNOSIS — I252 Old myocardial infarction: Secondary | ICD-10-CM

## 2019-01-09 DIAGNOSIS — R911 Solitary pulmonary nodule: Secondary | ICD-10-CM | POA: Diagnosis present

## 2019-01-09 DIAGNOSIS — S0240BD Malar fracture, left side, subsequent encounter for fracture with routine healing: Secondary | ICD-10-CM | POA: Diagnosis not present

## 2019-01-09 DIAGNOSIS — S02630A Fracture of coronoid process of mandible, unspecified side, initial encounter for closed fracture: Secondary | ICD-10-CM

## 2019-01-09 DIAGNOSIS — F17210 Nicotine dependence, cigarettes, uncomplicated: Secondary | ICD-10-CM | POA: Diagnosis present

## 2019-01-09 HISTORY — DX: Other specified health status: Z78.9

## 2019-01-09 LAB — CBC WITH DIFFERENTIAL/PLATELET
Abs Immature Granulocytes: 0.08 10*3/uL — ABNORMAL HIGH (ref 0.00–0.07)
BASOS ABS: 0.1 10*3/uL (ref 0.0–0.1)
Basophils Relative: 0 %
EOS PCT: 1 %
Eosinophils Absolute: 0.1 10*3/uL (ref 0.0–0.5)
HCT: 42.6 % (ref 39.0–52.0)
HEMOGLOBIN: 14.1 g/dL (ref 13.0–17.0)
Immature Granulocytes: 1 %
Lymphocytes Relative: 19 %
Lymphs Abs: 2.7 10*3/uL (ref 0.7–4.0)
MCH: 30.7 pg (ref 26.0–34.0)
MCHC: 33.1 g/dL (ref 30.0–36.0)
MCV: 92.8 fL (ref 80.0–100.0)
Monocytes Absolute: 0.7 10*3/uL (ref 0.1–1.0)
Monocytes Relative: 5 %
NEUTROS ABS: 10.4 10*3/uL — AB (ref 1.7–7.7)
NEUTROS PCT: 74 %
NRBC: 0 % (ref 0.0–0.2)
Platelets: 235 10*3/uL (ref 150–400)
RBC: 4.59 MIL/uL (ref 4.22–5.81)
RDW: 12.6 % (ref 11.5–15.5)
WBC: 14.1 10*3/uL — AB (ref 4.0–10.5)

## 2019-01-09 LAB — COMPREHENSIVE METABOLIC PANEL
ALBUMIN: 4 g/dL (ref 3.5–5.0)
ALK PHOS: 60 U/L (ref 38–126)
ALT: 22 U/L (ref 0–44)
ANION GAP: 9 (ref 5–15)
AST: 33 U/L (ref 15–41)
BUN: 14 mg/dL (ref 8–23)
CO2: 24 mmol/L (ref 22–32)
Calcium: 8.9 mg/dL (ref 8.9–10.3)
Chloride: 105 mmol/L (ref 98–111)
Creatinine, Ser: 1.26 mg/dL — ABNORMAL HIGH (ref 0.61–1.24)
GFR calc Af Amer: >60 mL/min (ref >60)
GFR calc non Af Amer: 56 mL/min — ABNORMAL LOW (ref >60)
GLUCOSE: 116 mg/dL — AB (ref 70–99)
POTASSIUM: 4.7 mmol/L (ref 3.5–5.1)
SODIUM: 138 mmol/L (ref 135–145)
Total Bilirubin: 0.7 mg/dL (ref 0.3–1.2)
Total Protein: 6.6 g/dL (ref 6.5–8.1)

## 2019-01-09 LAB — TROPONIN I: Troponin I: <0.03 ng/mL (ref <0.03)

## 2019-01-09 MED ORDER — HYDROMORPHONE HCL 1 MG/ML IJ SOLN
0.5000 mg | Freq: Once | INTRAMUSCULAR | Status: AC
  Administered 2019-01-09: 0.5 mg via INTRAVENOUS
  Filled 2019-01-09: qty 1

## 2019-01-09 MED ORDER — IOHEXOL 300 MG/ML  SOLN
100.0000 mL | Freq: Once | INTRAMUSCULAR | Status: AC | PRN
  Administered 2019-01-09: 100 mL via INTRAVENOUS
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
## 2019-01-09 NOTE — ED Notes (Signed)
Patient transported to CT 
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
## 2019-01-10 ENCOUNTER — Observation Stay (HOSPITAL_BASED_OUTPATIENT_CLINIC_OR_DEPARTMENT_OTHER): Payer: No Typology Code available for payment source

## 2019-01-10 ENCOUNTER — Observation Stay (HOSPITAL_COMMUNITY): Payer: No Typology Code available for payment source

## 2019-01-10 ENCOUNTER — Encounter (HOSPITAL_COMMUNITY): Payer: Self-pay | Admitting: Internal Medicine

## 2019-01-10 DIAGNOSIS — M79642 Pain in left hand: Secondary | ICD-10-CM | POA: Diagnosis not present

## 2019-01-10 DIAGNOSIS — R55 Syncope and collapse: Secondary | ICD-10-CM

## 2019-01-10 DIAGNOSIS — S0292XA Unspecified fracture of facial bones, initial encounter for closed fracture: Secondary | ICD-10-CM | POA: Diagnosis not present

## 2019-01-10 DIAGNOSIS — R001 Bradycardia, unspecified: Secondary | ICD-10-CM

## 2019-01-10 DIAGNOSIS — S2220XA Unspecified fracture of sternum, initial encounter for closed fracture: Secondary | ICD-10-CM

## 2019-01-10 DIAGNOSIS — S02630A Fracture of coronoid process of mandible, unspecified side, initial encounter for closed fracture: Secondary | ICD-10-CM

## 2019-01-10 DIAGNOSIS — S060X0A Concussion without loss of consciousness, initial encounter: Secondary | ICD-10-CM | POA: Diagnosis not present

## 2019-01-10 LAB — CBC
HCT: 40.2 % (ref 39.0–52.0)
Hemoglobin: 13.6 g/dL (ref 13.0–17.0)
MCH: 31.2 pg (ref 26.0–34.0)
MCHC: 33.8 g/dL (ref 30.0–36.0)
MCV: 92.2 fL (ref 80.0–100.0)
Platelets: 223 10*3/uL (ref 150–400)
RBC: 4.36 MIL/uL (ref 4.22–5.81)
RDW: 12.8 % (ref 11.5–15.5)
WBC: 17 10*3/uL — AB (ref 4.0–10.5)
nRBC: 0 % (ref 0.0–0.2)

## 2019-01-10 LAB — TROPONIN I: Troponin I: <0.03 ng/mL (ref <0.03)

## 2019-01-10 LAB — BASIC METABOLIC PANEL
Anion gap: 9 (ref 5–15)
BUN: 15 mg/dL (ref 8–23)
CO2: 27 mmol/L (ref 22–32)
Calcium: 9.2 mg/dL (ref 8.9–10.3)
Chloride: 104 mmol/L (ref 98–111)
Creatinine, Ser: 1.21 mg/dL (ref 0.61–1.24)
GFR calc Af Amer: >60 mL/min (ref >60)
GFR, EST NON AFRICAN AMERICAN: 59 mL/min — AB (ref >60)
Glucose, Bld: 138 mg/dL — ABNORMAL HIGH (ref 70–99)
Potassium: 4.7 mmol/L (ref 3.5–5.1)
SODIUM: 140 mmol/L (ref 135–145)

## 2019-01-10 LAB — MAGNESIUM: MAGNESIUM: 2 mg/dL (ref 1.7–2.4)

## 2019-01-10 LAB — ECHOCARDIOGRAM COMPLETE
Height: 65 in
Weight: 2080 oz

## 2019-01-10 LAB — TSH: TSH: 1.357 u[IU]/mL (ref 0.350–4.500)

## 2019-01-10 LAB — GLUCOSE, CAPILLARY: Glucose-Capillary: 100 mg/dL — ABNORMAL HIGH (ref 70–99)

## 2019-01-10 MED ORDER — TAMSULOSIN HCL 0.4 MG PO CAPS
0.4000 mg | ORAL_CAPSULE | Freq: Every day | ORAL | Status: DC
  Administered 2019-01-10 – 2019-01-11 (×2): 0.4 mg via ORAL
  Filled 2019-01-10 (×2): qty 1

## 2019-01-10 MED ORDER — LORAZEPAM 2 MG/ML IJ SOLN: 1.0000 mg | Freq: Four times a day (QID) | INTRAMUSCULAR | Status: DC | PRN

## 2019-01-10 MED ORDER — ONDANSETRON HCL 4 MG/2ML IJ SOLN: 4.0000 mg | Freq: Four times a day (QID) | INTRAMUSCULAR | Status: DC | PRN

## 2019-01-10 MED ORDER — ACETAMINOPHEN 325 MG PO TABS: 650.0000 mg | ORAL_TABLET | Freq: Four times a day (QID) | ORAL | Status: DC | PRN

## 2019-01-10 MED ORDER — ACETAMINOPHEN 650 MG RE SUPP: 650.0000 mg | Freq: Four times a day (QID) | RECTAL | Status: DC | PRN

## 2019-01-10 MED ORDER — ONDANSETRON HCL 4 MG PO TABS: 4.0000 mg | ORAL_TABLET | Freq: Four times a day (QID) | ORAL | Status: DC | PRN

## 2019-01-10 MED ORDER — VITAMIN B-1 100 MG PO TABS
100.0000 mg | ORAL_TABLET | Freq: Every day | ORAL | Status: DC
  Administered 2019-01-10 – 2019-01-11 (×2): 100 mg via ORAL
  Filled 2019-01-10 (×2): qty 1

## 2019-01-10 MED ORDER — LORAZEPAM 1 MG PO TABS: 1.0000 mg | ORAL_TABLET | Freq: Four times a day (QID) | ORAL | Status: DC | PRN

## 2019-01-10 MED ORDER — ADULT MULTIVITAMIN W/MINERALS CH
1.0000 | ORAL_TABLET | Freq: Every day | ORAL | Status: DC
  Administered 2019-01-10 – 2019-01-11 (×2): 1 via ORAL
  Filled 2019-01-10 (×2): qty 1

## 2019-01-10 MED ORDER — THIAMINE HCL 100 MG/ML IJ SOLN: 100.0000 mg | Freq: Every day | INTRAMUSCULAR | Status: DC

## 2019-01-10 MED ORDER — FOLIC ACID 1 MG PO TABS
1.0000 mg | ORAL_TABLET | Freq: Every day | ORAL | Status: DC
  Administered 2019-01-10 – 2019-01-11 (×2): 1 mg via ORAL
  Filled 2019-01-10 (×2): qty 1

## 2019-01-10 MED ORDER — IBUPROFEN 400 MG PO TABS
400.0000 mg | ORAL_TABLET | Freq: Once | ORAL | Status: AC
  Administered 2019-01-10: 400 mg via ORAL
  Filled 2019-01-10: qty 1

## 2019-01-10 NOTE — Procedures (Signed)
ELECTROENCEPHALOGRAM REPORT   Patient: Michael Edwards       Room #: 8G50I EEG No. ID: [id number] Age: 73 y.o.        Sex: male Referring Physician: Aileen Fass Report Date:  01/10/2019        Interpreting Physician: Alexis Goodell  History: Michael Edwards is an 73 y.o. male with syncope evaluated to rule out seizure  Medications:  Folic Acid, MVI, Flomax, Thiamine  Conditions of Recording:  This is a 21 channel routine scalp EEG performed with bipolar and monopolar montages arranged in accordance to the international 10/20 system of electrode placement. One channel was dedicated to EKG recording.  The patient is in the awake state.  Description:  Artifact is prominent during the recording obscuring the background rhythm for the majority of the recording.  On only rare and brief occasions could a posterior background rhythm be approximated.  It is approximated at 10Hz  alpha.  Further details about the record are unable to determined due to the degree of artifact.  The patient does not drowse or sleep. Hyperventilation was not performed.  Intermittent photic stimulation was performed and seemed to elicit some driving response.   IMPRESSION: This is a technically difficult record due to the predominance of muscle and movement artifact.  A normal posterior background rhythm could be elucidated but no other record details could be reliably determined.     Alexis Goodell, MD Neurology [phone] 01/10/2019, 12:44 PM

## 2019-01-10 NOTE — Discharge Instructions (Signed)
Fracture precautions: 1. Elevate head of bed 2. Ice compress to periorbital region 3. Avoid additional trauma, nose blowing or sneezing 4. Liquid and soft diet as tolerated 5. Saline nasal spray 4 times a day and when necessary  Follow-up [facility] in approximately 2 weeks or sooner as needed.  Call for any additional facial concerns at [phone].
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
## 2019-01-10 NOTE — Consult Note (Signed)
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  Monitor for additional symptoms and follow-up at [facility] [phone]) as an outpatient in 2 weeks for recheck or sooner as needed.  Fracture precautions: 1. Elevate head of bed 2. Ice compress to periorbital region 3. Avoid additional trauma, nose blowing or sneezing 4. Liquid and soft diet as tolerated 5. Saline nasal spray 4 times a day and when necessary  Jerrell Belfast 01/10/2019, 4:03 PM
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
## 2019-01-10 NOTE — Progress Notes (Signed)
EEG Completed; Results Pending  
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
## 2019-01-10 NOTE — Progress Notes (Signed)
Pt arrived on unit A&Ox4; no distress. Ambulates w/1 assist. Able to communicate wants and needs without difficulty.

## 2019-01-10 NOTE — Care Management Obs Status (Signed)
[facility] NOTIFICATION   Patient Details  Name: Michael Edwards MRN: [id number] Date of Birth: [birth year]   Medicare Observation Status Notification Given:  Yes    Marilu Favre, RN 01/10/2019, 11:12 AM

## 2019-01-10 NOTE — Progress Notes (Signed)
Echocardiogram 2D Echocardiogram has been performed.  Michael Edwards 01/10/2019, 1:29 PM

## 2019-01-10 NOTE — ED Notes (Addendum)
Pt has an enlarged prostate and self-catheterizes 3x/day while at home. Per PA pt can be given supplies to self-catheterize at his convenience. Pt given coude catheter and supplies to sterilize prior to self-catheterization per his home routine.
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
## 2019-01-11 ENCOUNTER — Encounter (HOSPITAL_COMMUNITY): Payer: Self-pay | Admitting: Physician Assistant

## 2019-01-11 DIAGNOSIS — M545 Low back pain: Secondary | ICD-10-CM | POA: Diagnosis present

## 2019-01-11 DIAGNOSIS — S0232XA Fracture of orbital floor, left side, initial encounter for closed fracture: Secondary | ICD-10-CM | POA: Diagnosis present

## 2019-01-11 DIAGNOSIS — I348 Other nonrheumatic mitral valve disorders: Secondary | ICD-10-CM | POA: Diagnosis present

## 2019-01-11 DIAGNOSIS — R55 Syncope and collapse: Secondary | ICD-10-CM | POA: Diagnosis not present

## 2019-01-11 DIAGNOSIS — R001 Bradycardia, unspecified: Secondary | ICD-10-CM | POA: Diagnosis present

## 2019-01-11 DIAGNOSIS — S0240DA Maxillary fracture, left side, initial encounter for closed fracture: Secondary | ICD-10-CM | POA: Diagnosis present

## 2019-01-11 DIAGNOSIS — R911 Solitary pulmonary nodule: Secondary | ICD-10-CM | POA: Diagnosis present

## 2019-01-11 DIAGNOSIS — S0240FA Zygomatic fracture, left side, initial encounter for closed fracture: Secondary | ICD-10-CM | POA: Diagnosis present

## 2019-01-11 DIAGNOSIS — S060X0A Concussion without loss of consciousness, initial encounter: Secondary | ICD-10-CM | POA: Diagnosis not present

## 2019-01-11 DIAGNOSIS — S02842A Fracture of lateral orbital wall, left side, initial encounter for closed fracture: Secondary | ICD-10-CM | POA: Diagnosis present

## 2019-01-11 DIAGNOSIS — N529 Male erectile dysfunction, unspecified: Secondary | ICD-10-CM | POA: Diagnosis present

## 2019-01-11 DIAGNOSIS — M79642 Pain in left hand: Secondary | ICD-10-CM | POA: Diagnosis present

## 2019-01-11 DIAGNOSIS — S02632A Fracture of coronoid process of left mandible, initial encounter for closed fracture: Secondary | ICD-10-CM | POA: Diagnosis present

## 2019-01-11 DIAGNOSIS — S2220XA Unspecified fracture of sternum, initial encounter for closed fracture: Secondary | ICD-10-CM | POA: Diagnosis present

## 2019-01-11 DIAGNOSIS — Y92410 Unspecified street and highway as the place of occurrence of the external cause: Secondary | ICD-10-CM | POA: Diagnosis not present

## 2019-01-11 DIAGNOSIS — S0219XA Other fracture of base of skull, initial encounter for closed fracture: Secondary | ICD-10-CM | POA: Diagnosis present

## 2019-01-11 DIAGNOSIS — I959 Hypotension, unspecified: Secondary | ICD-10-CM | POA: Diagnosis present

## 2019-01-11 DIAGNOSIS — I252 Old myocardial infarction: Secondary | ICD-10-CM | POA: Diagnosis not present

## 2019-01-11 DIAGNOSIS — J432 Centrilobular emphysema: Secondary | ICD-10-CM | POA: Diagnosis present

## 2019-01-11 DIAGNOSIS — N400 Enlarged prostate without lower urinary tract symptoms: Secondary | ICD-10-CM | POA: Diagnosis present

## 2019-01-11 DIAGNOSIS — I350 Nonrheumatic aortic (valve) stenosis: Secondary | ICD-10-CM | POA: Diagnosis present

## 2019-01-11 DIAGNOSIS — S0292XA Unspecified fracture of facial bones, initial encounter for closed fracture: Secondary | ICD-10-CM | POA: Diagnosis not present

## 2019-01-11 DIAGNOSIS — F17210 Nicotine dependence, cigarettes, uncomplicated: Secondary | ICD-10-CM | POA: Diagnosis present

## 2019-01-11 DIAGNOSIS — I2510 Atherosclerotic heart disease of native coronary artery without angina pectoris: Secondary | ICD-10-CM | POA: Diagnosis present

## 2019-01-11 DIAGNOSIS — I700 Atherosclerosis of aorta: Secondary | ICD-10-CM | POA: Diagnosis present

## 2019-01-11 DIAGNOSIS — G8929 Other chronic pain: Secondary | ICD-10-CM | POA: Diagnosis present

## 2019-01-11 DIAGNOSIS — S0511XA Contusion of eyeball and orbital tissues, right eye, initial encounter: Secondary | ICD-10-CM | POA: Diagnosis present

## 2019-01-11 DIAGNOSIS — S060X9A Concussion with loss of consciousness of unspecified duration, initial encounter: Secondary | ICD-10-CM | POA: Diagnosis present

## 2019-01-11 MED ORDER — SODIUM CHLORIDE 0.9 % WEIGHT BASED INFUSION
1.0000 mL/kg/h | INTRAVENOUS | Status: DC
  Administered 2019-01-12: 1 mL/kg/h via INTRAVENOUS

## 2019-01-11 MED ORDER — SODIUM CHLORIDE 0.9 % WEIGHT BASED INFUSION
3.0000 mL/kg/h | INTRAVENOUS | Status: DC
  Administered 2019-01-12: 3 mL/kg/h via INTRAVENOUS

## 2019-01-11 MED ORDER — SODIUM CHLORIDE 0.9 % IV SOLN: 250.0000 mL | INTRAVENOUS | Status: DC | PRN

## 2019-01-11 MED ORDER — SODIUM CHLORIDE 0.9% FLUSH: 3.0000 mL | INTRAVENOUS | Status: DC | PRN

## 2019-01-11 MED ORDER — SODIUM CHLORIDE 0.9% FLUSH
3.0000 mL | Freq: Two times a day (BID) | INTRAVENOUS | Status: DC
  Administered 2019-01-11: 3 mL via INTRAVENOUS

## 2019-01-11 MED ORDER — ASPIRIN 81 MG PO CHEW
81.0000 mg | CHEWABLE_TABLET | ORAL | Status: AC
  Administered 2019-01-12: 81 mg via ORAL
  Filled 2019-01-11: qty 1

## 2019-01-11 NOTE — H&P (View-Only) (Signed)
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 matter ischemic changes noted. Vascular: Carotid atherosclerotic calcifications noted. Skull: LEFT-sided facial fractures will be discussed below. Other: None CT MAXILLOFACIAL FINDINGS Osseous: Fractures of the lateral wall and floor of the LEFT orbit (nondisplaced), anterior and lateral walls of the LEFT maxillary sinus, LEFT zygoma (mildly depressed) and at the base of the LEFT mandibular coronoid process (mildly displaced). The pterygoid plates are intact. Orbits: The globes retain their spherical shape. No intraconal abnormality. Sinuses: A small amount of bloody in the LEFT/maxillary sinus noted. Mucosal thickening within other scattered paranasal sinuses noted. The mastoid air cells and middle/inner ears are clear. Soft tissues: Facial soft tissue swelling noted. CT CERVICAL SPINE FINDINGS Alignment: Normal. Skull base and vertebrae: No acute fracture. No primary bone lesion or focal pathologic process. Soft tissues and spinal canal: No prevertebral fluid or swelling. No visible canal hematoma. Disc levels: Mild  degenerative disc disease and spondylosis at C4-5 and C5-6 noted. Upper chest: No acute abnormality. Emphysema in the UPPER lungs noted. Other: None IMPRESSION: 1. No evidence of acute intracranial abnormality. Mild atrophy and chronic small-vessel white matter ischemic changes. 2. Fractures of the lateral wall and floor of the LEFT orbit, LEFT zygoma, anterior and lateral walls of the LEFT maxillary sinus and LEFT mandibular coronoid process. 3. No static evidence of acute injury to the cervical spine. Mild degenerative changes at C4-5 and C5-6. 4.  Emphysema (ICD10-J43.9). Electronically Signed   By: Margarette Canada M.D.   On: 01/09/2019 22:00    Assessment and Plan:   1. Syncope with severe aortic stenosis, but also brief demonstration of bradycardia in the ER - syncope and presyncope from severe AS frequently manifests in the form of exercise-induced although the patient was at rest when the event occurred so cannot fully exclude the possibility of a concomitant arrhythmia. He did have an episode of transient bradycardia in the ED which reproduced his presenting symptoms. Regardless of mechanism,  in the presence of syncope itself, he requires further management to address his valve. Will discuss sequence of further evaluation with MD. Suspect we will need to involve structural team and EP. Note patient is not on any AVN blocking agents and TSH is wnl. Also discussed no driving x 6 months with patient.  2. Reported remote h/o MI - not clear what this represents (too old for EMR records), but anticipate formal ischemic workup with evaluation of his aortic stenosis.  3. Ongoing tobacco abuse - counseled regarding importance of cessation especially since CT shows emphysema and lung nodule. This will need OP f/u.  4. Aortic atherosclerosis - will add lipid profile to AM labs. Consider statin.  For questions or updates, please contact [place] Please consult [email or web address] for contact info under  Cardiology/STEMI.    Signed, Charlie Pitter, PA-C  01/11/2019 2:29 PM
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
## 2019-01-11 NOTE — Plan of Care (Signed)

## 2019-01-11 NOTE — Progress Notes (Signed)
   Update: Right and left heart cath is tentatively scheduled for 4pm tomorrow, but the cath lab may be involving an additional doctor to assist with cases so may go earlier. They advised I hold off on giving patient breakfast in case patient can go for procedure earlier. Dayna Dunn PA-C
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
## 2019-01-11 NOTE — Plan of Care (Signed)
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
## 2019-01-12 ENCOUNTER — Other Ambulatory Visit: Payer: Self-pay | Admitting: Physician Assistant

## 2019-01-12 ENCOUNTER — Inpatient Hospital Stay (HOSPITAL_COMMUNITY): Payer: No Typology Code available for payment source

## 2019-01-12 ENCOUNTER — Telehealth: Payer: Self-pay

## 2019-01-12 ENCOUNTER — Encounter (HOSPITAL_COMMUNITY): Payer: Self-pay | Admitting: Cardiovascular Disease

## 2019-01-12 ENCOUNTER — Encounter (HOSPITAL_COMMUNITY)
Admission: EM | Disposition: A | Discharge status: Home / Self Care | Payer: Self-pay | Source: Home / Self Care | Attending: Internal Medicine

## 2019-01-12 DIAGNOSIS — R55 Syncope and collapse: Secondary | ICD-10-CM

## 2019-01-12 HISTORY — PX: RIGHT HEART CATH AND CORONARY ANGIOGRAPHY: CATH118264

## 2019-01-12 LAB — POCT I-STAT EG7
Bicarbonate: 26.5 mmol/L (ref 20.0–28.0)
Calcium, Ion: 1.18 mmol/L (ref 1.15–1.40)
HCT: 36 % — ABNORMAL LOW (ref 39.0–52.0)
Hemoglobin: 12.2 g/dL — ABNORMAL LOW (ref 13.0–17.0)
O2 Saturation: 69 %
Potassium: 4.1 mmol/L (ref 3.5–5.1)
Sodium: 139 mmol/L (ref 135–145)
TCO2: 28 mmol/L (ref 22–32)
pCO2, Ven: 47.3 mmHg (ref 44.0–60.0)
pH, Ven: 7.356 (ref 7.250–7.430)
pO2, Ven: 38 mmHg (ref 32.0–45.0)

## 2019-01-12 LAB — POCT I-STAT 7, (LYTES, BLD GAS, ICA,H+H)
Bicarbonate: 25.7 mmol/L (ref 20.0–28.0)
CALCIUM ION: 1.15 mmol/L (ref 1.15–1.40)
HCT: 35 % — ABNORMAL LOW (ref 39.0–52.0)
Hemoglobin: 11.9 g/dL — ABNORMAL LOW (ref 13.0–17.0)
O2 Saturation: 98 %
Potassium: 4 mmol/L (ref 3.5–5.1)
Sodium: 139 mmol/L (ref 135–145)
TCO2: 27 mmol/L (ref 22–32)
pCO2 arterial: 43.4 mmHg (ref 32.0–48.0)
pH, Arterial: 7.38 (ref 7.350–7.450)
pO2, Arterial: 104 mmHg (ref 83.0–108.0)

## 2019-01-12 LAB — BASIC METABOLIC PANEL
Anion gap: 7 (ref 5–15)
BUN: 11 mg/dL (ref 8–23)
CHLORIDE: 106 mmol/L (ref 98–111)
CO2: 24 mmol/L (ref 22–32)
Calcium: 8.5 mg/dL — ABNORMAL LOW (ref 8.9–10.3)
Creatinine, Ser: 1.03 mg/dL (ref 0.61–1.24)
GFR calc Af Amer: >60 mL/min (ref >60)
GFR calc non Af Amer: >60 mL/min (ref >60)
Glucose, Bld: 103 mg/dL — ABNORMAL HIGH (ref 70–99)
Potassium: 3.9 mmol/L (ref 3.5–5.1)
Sodium: 137 mmol/L (ref 135–145)

## 2019-01-12 LAB — CBC
HEMATOCRIT: 38.1 % — AB (ref 39.0–52.0)
Hemoglobin: 12.7 g/dL — ABNORMAL LOW (ref 13.0–17.0)
MCH: 30.4 pg (ref 26.0–34.0)
MCHC: 33.3 g/dL (ref 30.0–36.0)
MCV: 91.1 fL (ref 80.0–100.0)
Platelets: 204 10*3/uL (ref 150–400)
RBC: 4.18 MIL/uL — AB (ref 4.22–5.81)
RDW: 12.3 % (ref 11.5–15.5)
WBC: 10.2 10*3/uL (ref 4.0–10.5)
nRBC: 0 % (ref 0.0–0.2)

## 2019-01-12 LAB — LIPID PANEL
Cholesterol: 156 mg/dL (ref 0–200)
HDL: 65 mg/dL (ref >40)
LDL Cholesterol: 79 mg/dL (ref 0–99)
Total CHOL/HDL Ratio: 2.4 RATIO
Triglycerides: 62 mg/dL (ref <150)
VLDL: 12 mg/dL (ref 0–40)

## 2019-01-12 SURGERY — RIGHT HEART CATH AND CORONARY ANGIOGRAPHY
Anesthesia: LOCAL

## 2019-01-12 MED ORDER — MIDAZOLAM HCL 2 MG/2ML IJ SOLN
INTRAMUSCULAR | Status: AC
  Filled 2019-01-12: qty 2

## 2019-01-12 MED ORDER — VERAPAMIL HCL 2.5 MG/ML IV SOLN
INTRAVENOUS | Status: AC
  Filled 2019-01-12: qty 2

## 2019-01-12 MED ORDER — LIDOCAINE HCL (PF) 1 % IJ SOLN
INTRAMUSCULAR | Status: AC
  Filled 2019-01-12: qty 30

## 2019-01-12 MED ORDER — PNEUMOCOCCAL VAC POLYVALENT 25 MCG/0.5ML IJ INJ: 0.5000 mL | INJECTION | INTRAMUSCULAR | Status: DC

## 2019-01-12 MED ORDER — HEPARIN SODIUM (PORCINE) 1000 UNIT/ML IJ SOLN
INTRAMUSCULAR | Status: DC | PRN
  Administered 2019-01-12: 2500 [IU] via INTRAVENOUS

## 2019-01-12 MED ORDER — SODIUM CHLORIDE 0.9 % IV SOLN: 250.0000 mL | INTRAVENOUS | Status: DC | PRN

## 2019-01-12 MED ORDER — SODIUM CHLORIDE 0.9 % IV SOLN
INTRAVENOUS | Status: AC | PRN
  Administered 2019-01-12: 10 mL/h via INTRAVENOUS

## 2019-01-12 MED ORDER — SODIUM CHLORIDE 0.9% FLUSH: 3.0000 mL | INTRAVENOUS | Status: DC | PRN

## 2019-01-12 MED ORDER — MIDAZOLAM HCL 2 MG/2ML IJ SOLN
INTRAMUSCULAR | Status: DC | PRN
  Administered 2019-01-12 (×2): 1 mg via INTRAVENOUS

## 2019-01-12 MED ORDER — VERAPAMIL HCL 2.5 MG/ML IV SOLN
INTRAVENOUS | Status: DC | PRN
  Administered 2019-01-12: 10 mL via INTRA_ARTERIAL

## 2019-01-12 MED ORDER — INFLUENZA VAC SPLIT HIGH-DOSE 0.5 ML IM SUSY: 0.5000 mL | PREFILLED_SYRINGE | INTRAMUSCULAR | Status: DC

## 2019-01-12 MED ORDER — LIDOCAINE HCL (PF) 1 % IJ SOLN
INTRAMUSCULAR | Status: DC | PRN
  Administered 2019-01-12 (×2): 2 mL

## 2019-01-12 MED ORDER — HEPARIN (PORCINE) IN NACL 1000-0.9 UT/500ML-% IV SOLN
INTRAVENOUS | Status: AC
  Filled 2019-01-12: qty 1000

## 2019-01-12 MED ORDER — IOHEXOL 350 MG/ML SOLN
INTRAVENOUS | Status: DC | PRN
  Administered 2019-01-12: 30 mL via INTRA_ARTERIAL

## 2019-01-12 MED ORDER — HEPARIN SODIUM (PORCINE) 1000 UNIT/ML IJ SOLN
INTRAMUSCULAR | Status: AC
  Filled 2019-01-12: qty 1

## 2019-01-12 MED ORDER — SODIUM CHLORIDE 0.9 % IV SOLN: INTRAVENOUS | Status: AC

## 2019-01-12 MED ORDER — FENTANYL CITRATE (PF) 100 MCG/2ML IJ SOLN
INTRAMUSCULAR | Status: AC
  Filled 2019-01-12: qty 2

## 2019-01-12 MED ORDER — SODIUM CHLORIDE 0.9% FLUSH: 3.0000 mL | Freq: Two times a day (BID) | INTRAVENOUS | Status: DC

## 2019-01-12 MED ORDER — HEPARIN (PORCINE) IN NACL 1000-0.9 UT/500ML-% IV SOLN
INTRAVENOUS | Status: DC | PRN
Start: 2019-01-12 — End: 2019-01-12
  Administered 2019-01-12 (×2): 500 mL

## 2019-01-12 MED ORDER — FENTANYL CITRATE (PF) 100 MCG/2ML IJ SOLN
INTRAMUSCULAR | Status: DC | PRN
  Administered 2019-01-12: 25 ug via INTRAVENOUS

## 2019-01-12 SURGICAL SUPPLY — 12 items
CATH BALLN WEDGE 5F 110CM (CATHETERS) ×2 IMPLANT
CATH INFINITI 5FR JK (CATHETERS) ×2 IMPLANT
DEVICE RAD COMP TR BAND LRG (VASCULAR PRODUCTS) ×2 IMPLANT
GLIDESHEATH SLEND SS 6F .021 (SHEATH) ×2 IMPLANT
GUIDEWIRE .025 260CM (WIRE) ×2 IMPLANT
GUIDEWIRE INQWIRE 1.5J.035X260 (WIRE) ×1 IMPLANT
INQWIRE 1.5J .035X260CM (WIRE) ×2
KIT HEART LEFT (KITS) ×2 IMPLANT
PACK CARDIAC CATHETERIZATION (CUSTOM PROCEDURE TRAY) ×2 IMPLANT
SHEATH GLIDE SLENDER 4/5FR (SHEATH) ×2 IMPLANT
TRANSDUCER W/STOPCOCK (MISCELLANEOUS) ×2 IMPLANT
TUBING CIL FLEX 10 FLL-RA (TUBING) ×2 IMPLANT
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
## 2019-01-12 NOTE — Progress Notes (Addendum)
   The case was reviewed with our structural team. Per group discussion, Dr. Debara Pickett feels the patient can continue workup as outpatient to include the following:  - 30-day event monitor. Since the patient lives in [place], I spoke with our [place] office regarding the request and also sent triage a staff message to request based on that discussion. I asked their office to call the patient for further instructions - they may actually be mailing him the monitor. These typically have to be precerted through insurance.   - F/u has been arranged with Structural Heart MD Dr. Angelena Form 3/26 at 2pm.  Appt info was listed on AVS. Reviewed instructions with pt's wife. Will also reach out to IM attending. Shelia Magallon PA-C
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
## 2019-01-12 NOTE — Progress Notes (Signed)
Carotid artery duplex has been completed. Preliminary results can be found in CV Proc through chart review.   01/12/19 2:13 PM Michael Edwards RVT

## 2019-01-12 NOTE — Progress Notes (Deleted)
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     Microbiology: No results found for this or any previous visit (from the past 240 hour(s)).   Labs: BNP (last 3 results) No results for input(s): BNP in the last 8760 hours. Basic Metabolic Panel: Recent Labs  Lab 01/09/19 2055 01/10/19 0318 01/12/19 0412 01/12/19 0947  NA 138 140 137 139  139  K 4.7 4.7 3.9 4.0  4.1  CL 105 104 106  --   CO2 24 27 24   --   GLUCOSE 116* 138* 103*  --   BUN 14 15 11   --   CREATININE 1.26* 1.21 1.03  --   CALCIUM 8.9 9.2 8.5*  --   MG  --  2.0  --   --    Liver Function Tests: Recent Labs  Lab 01/09/19 2055  AST 33  ALT 22  ALKPHOS 60  BILITOT 0.7  PROT 6.6  ALBUMIN 4.0   No results for input(s): LIPASE, AMYLASE in the last 168 hours. No results for input(s): AMMONIA in the last 168 hours. CBC: Recent Labs  Lab 01/09/19 2055 01/10/19 0318 01/12/19 0412 01/12/19 0947  WBC 14.1* 17.0* 10.2  --   NEUTROABS 10.4*  --   --    --   HGB 14.1 13.6 12.7* 11.9*  12.2*  HCT 42.6 40.2 38.1* 35.0*  36.0*  MCV 92.8 92.2 91.1  --   PLT 235 223 204  --    Cardiac Enzymes: Recent Labs  Lab 01/09/19 2055 01/10/19 0318  TROPONINI <0.03 <0.03   BNP: Invalid input(s): POCBNP CBG: Recent Labs  Lab 01/10/19 1119  GLUCAP 100*   D-Dimer No results for input(s): DDIMER in the last 72 hours. Hgb A1c No results for input(s): HGBA1C in the last 72 hours. Lipid Profile Recent Labs    01/12/19 0412  CHOL 156  HDL 65  LDLCALC 79  TRIG 62  CHOLHDL 2.4   Thyroid function studies Recent Labs    01/10/19 0318  TSH 1.357   Anemia work up No results for input(s): VITAMINB12, FOLATE, FERRITIN, TIBC, IRON, RETICCTPCT in the last 72 hours. Urinalysis    Component  Value Date/Time   APPEARANCEUR Cloudy (A) 07/27/2017 1020   GLUCOSEU Negative 07/27/2017 1020   BILIRUBINUR Negative 07/27/2017 1020   PROTEINUR Trace (A) 07/27/2017 1020   NITRITE Positive (A) 07/27/2017 1020   LEUKOCYTESUR 2+ (A) 07/27/2017 1020   Sepsis Labs Invalid input(s): PROCALCITONIN,  WBC,  LACTICIDVEN Microbiology No results found for this or any previous visit (from the past 240 hour(s)).   Time coordinating discharge: 40 minutes  SIGNED:   Charlynne Cousins, MD  [facility]

## 2019-01-12 NOTE — Progress Notes (Signed)
[facility] BAND REMOVAL  LOCATION:    right radial  DEFLATED PER PROTOCOL:    Yes.    TIME BAND OFF / DRESSING APPLIED:    1300   SITE UPON ARRIVAL:    Level 0  SITE AFTER BAND REMOVAL:    Level 0  CIRCULATION SENSATION AND MOVEMENT:    Within Normal Limits   Yes.    COMMENTS:   Tolerated well, no hematoma noted, no bleeding noted. Soft to touch. Post removal instructions provided and teach back effective. Call bell is in reach. Right arm in on pillow. Coban removed from right arm and 2x2 dressing applied with op-site.

## 2019-01-12 NOTE — Interval H&P Note (Signed)
History and Physical Interval Note:  01/12/2019 9:24 AM  Michael Edwards  has presented today for surgery, with the diagnosis of Aortic stenosis.  The various methods of treatment have been discussed with the patient and family. After consideration of risks, benefits and other options for treatment, the patient has consented to  Procedure(s): RIGHT HEART CATH AND CORONARY ANGIOGRAPHY (N/A) as a surgical intervention.  The patient's history has been reviewed, patient examined, no change in status, stable for surgery.  I have reviewed the patient's chart and labs.  Questions were answered to the patient's satisfaction.     [name]
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
## 2019-01-12 NOTE — Telephone Encounter (Signed)
Received request from [employer] [place], [place] for cardiac event monitor.  I made call to patient to verify phone number. I gave instructions verbally to pt and wife who expressed understanding, " [employer] AT: We will place order and you will receive it in the mail.  You may get a call from [employer] @ either  [phone] Or  [phone] for them to confirm your address before it will be sent to you.  You will wear the monitor for 14 days, remove it and send it back to the company. They will send [place] a report. Then we will call you with the results."  Pt reported that he feels he can place at home, I urged him to call for RN visit if he has any complications. Order placed by provider. I enrolled pt in [employer] website with up to date contact information.   Advised pt to call for any further questions or concerns.

## 2019-01-15 ENCOUNTER — Encounter: Payer: Self-pay | Admitting: Physician Assistant

## 2019-01-15 NOTE — Discharge Summary (Signed)
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 reference.     Microbiology: No results found for this or any previous visit (from the past 240 hour(s)).   Labs: BNP (last 3 results) RecentLabs(withinlast365days)  No results for input(s): BNP in the last 8760 hours.   Basic Metabolic Panel: LastLabs        Recent Labs  Lab 01/09/19 2055 01/10/19 0318 01/12/19 0412 01/12/19 0947  NA 138 140 137 139  139  K 4.7 4.7 3.9 4.0  4.1  CL 105 104 106  --   CO2 24 27 24   --   GLUCOSE 116* 138* 103*  --   BUN 14 15 11   --   CREATININE 1.26* 1.21 1.03  --   CALCIUM 8.9 9.2 8.5*  --   MG  --  2.0  --   --      Liver Function Tests: LastLabs     Recent Labs  Lab 01/09/19 2055  AST 33  ALT 22  ALKPHOS 60  BILITOT 0.7  PROT 6.6  ALBUMIN 4.0     LastLabs  No results for input(s): LIPASE, AMYLASE in the last 168  hours.   LastLabs  No results for input(s): AMMONIA in the last 168 hours.   CBC: LastLabs        Recent Labs  Lab 01/09/19 2055 01/10/19 0318 01/12/19 0412 01/12/19 0947  WBC 14.1* 17.0* 10.2  --   NEUTROABS 10.4*  --   --   --   HGB 14.1 13.6 12.7* 11.9*  12.2*  HCT 42.6 40.2 38.1* 35.0*  36.0*  MCV 92.8 92.2 91.1  --   PLT 235 223 204  --      Cardiac Enzymes: LastLabs      Recent Labs  Lab 01/09/19 2055 01/10/19 0318  TROPONINI <0.03 <0.03     BNP: LastLabs  Invalid input(s): POCBNP   CBG: LastLabs     Recent Labs  Lab 01/10/19 1119  GLUCAP 100*     D-Dimer RecentLabs(last2labs)  No results for input(s): DDIMER in the last 72 hours.   Hgb A1c RecentLabs(last2labs)  No results for input(s): HGBA1C  in the last 72 hours.   Lipid Profile RecentLabs(last2labs)     Recent Labs    01/12/19 0412  CHOL 156  HDL 65  LDLCALC 79  TRIG 62  CHOLHDL 2.4     Thyroid function studies RecentLabs(last2labs)     Recent Labs    01/10/19 0318  TSH 1.357     Anemia work up RecentLabs(last2labs)  No results for input(s): VITAMINB12, FOLATE, FERRITIN, TIBC, IRON, RETICCTPCT in the last 72 hours.   Urinalysis Labs(Brief)          Component Value Date/Time   APPEARANCEUR Cloudy (A) 07/27/2017 1020   GLUCOSEU Negative 07/27/2017 1020   BILIRUBINUR Negative 07/27/2017 1020   PROTEINUR Trace (A) 07/27/2017 1020   NITRITE Positive (A) 07/27/2017 1020   LEUKOCYTESUR 2+ (A) 07/27/2017 1020     Sepsis Labs LastLabs  Invalid input(s): PROCALCITONIN,  WBC,  LACTICIDVEN   Microbiology No results found for this or any previous visit (from the past 240 hour(s)).   Time coordinating discharge: 40 minutes  SIGNED:   Charlynne Cousins, MD         [facility]

## 2019-01-18 ENCOUNTER — Ambulatory Visit (INDEPENDENT_AMBULATORY_CARE_PROVIDER_SITE_OTHER): Payer: Medicare Other

## 2019-01-18 DIAGNOSIS — R55 Syncope and collapse: Secondary | ICD-10-CM
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
## 2019-01-25 ENCOUNTER — Other Ambulatory Visit: Payer: Self-pay

## 2019-01-25 ENCOUNTER — Encounter: Payer: Self-pay | Admitting: Cardiovascular Disease

## 2019-01-25 ENCOUNTER — Ambulatory Visit (INDEPENDENT_AMBULATORY_CARE_PROVIDER_SITE_OTHER): Payer: Medicare Other | Admitting: Cardiovascular Disease

## 2019-01-25 VITALS — BP 110/60 | HR 95 | Ht 64.0 in | Wt 126.4 lb

## 2019-01-25 DIAGNOSIS — R55 Syncope and collapse: Secondary | ICD-10-CM

## 2019-01-25 DIAGNOSIS — I350 Nonrheumatic aortic (valve) stenosis: Secondary | ICD-10-CM | POA: Diagnosis not present

## 2019-01-25 NOTE — Patient Instructions (Signed)
Medication Instructions:  Your physician recommends that you continue on your current medications as directed. Please refer to the Current Medication list given to you today.  If you need a refill on your cardiac medications before your next appointment, please call your pharmacy.   Lab work: None If you have labs (blood work) drawn today and your tests are completely normal, you will receive your results only by: Marland Kitchen MyChart Message (if you have MyChart) OR . A paper copy in the mail If you have any lab test that is abnormal or we need to change your treatment, we will call you to review the results.  Testing/Procedures: Your physician has requested that you have cardiac CT. Cardiac computed tomography (CT) is a painless test that uses an x-ray machine to take clear, detailed pictures of your heart. For further information please visit [facility]. Please follow instruction sheet as given.   Your physician has recommended that you have a pulmonary function test. Pulmonary Function Tests are a group of tests that measure how well air moves in and out of your lungs.    Follow-Up: Theodosia Quay, RN will be in contact with you to get you scheduled for your testing.   Any Other Special Instructions Will Be Listed Below (If Applicable). None

## 2019-01-26 ENCOUNTER — Other Ambulatory Visit: Payer: Self-pay

## 2019-01-26 ENCOUNTER — Ambulatory Visit (HOSPITAL_COMMUNITY)
Admission: RE | Admit: 2019-01-26 | Discharge: 2019-01-26 | Disposition: A | Discharge status: Home / Self Care | Payer: Medicare Other | Source: Ambulatory Visit | Attending: Cardiovascular Disease | Admitting: Cardiovascular Disease

## 2019-01-26 ENCOUNTER — Encounter: Payer: Self-pay | Admitting: Physical Therapy

## 2019-01-26 ENCOUNTER — Ambulatory Visit: Payer: Medicare Other | Attending: Cardiovascular Disease | Admitting: Physical Therapy

## 2019-01-26 DIAGNOSIS — R55 Syncope and collapse: Secondary | ICD-10-CM

## 2019-01-26 DIAGNOSIS — R942 Abnormal results of pulmonary function studies: Secondary | ICD-10-CM | POA: Diagnosis not present

## 2019-01-26 DIAGNOSIS — I350 Nonrheumatic aortic (valve) stenosis: Secondary | ICD-10-CM

## 2019-01-26 DIAGNOSIS — N400 Enlarged prostate without lower urinary tract symptoms: Secondary | ICD-10-CM | POA: Diagnosis not present

## 2019-01-26 DIAGNOSIS — F17210 Nicotine dependence, cigarettes, uncomplicated: Secondary | ICD-10-CM | POA: Insufficient documentation

## 2019-01-26 DIAGNOSIS — R2689 Other abnormalities of gait and mobility: Secondary | ICD-10-CM | POA: Insufficient documentation

## 2019-01-26 LAB — PULMONARY FUNCTION TEST
DL/VA % pred: 57 %
DL/VA: 2.34 ml/min/mmHg/L
DLCO cor % pred: 68 %
DLCO cor: 14.19 ml/min/mmHg
DLCO unc % pred: 64 %
DLCO unc: 13.36 ml/min/mmHg
FEF 25-75 Post: 2.49 L/sec
FEF 25-75 Pre: 2.19 L/sec
FEF2575-%Change-Post: 14 %
FEF2575-%Pred-Post: 143 %
FEF2575-%Pred-Pre: 125 %
FEV1-%Change-Post: 1 %
FEV1-%PRED-PRE: 124 %
FEV1-%Pred-Post: 126 %
FEV1-Post: 3.01 L
FEV1-Pre: 2.97 L
FEV1FVC-%Change-Post: 5 %
FEV1FVC-%Pred-Pre: 99 %
FEV6-%CHANGE-POST: -3 %
FEV6-%PRED-POST: 128 %
FEV6-%Pred-Pre: 133 %
FEV6-Post: 3.95 L
FEV6-Pre: 4.09 L
FEV6FVC-%PRED-POST: 107 %
FEV6FVC-%Pred-Pre: 107 %
FVC-%Change-Post: -3 %
FVC-%Pred-Post: 119 %
FVC-%Pred-Pre: 123 %
FVC-Post: 3.95 L
FVC-Pre: 4.09 L
Post FEV1/FVC ratio: 76 %
Post FEV6/FVC ratio: 100 %
Pre FEV1/FVC ratio: 72 %
Pre FEV6/FVC Ratio: 100 %
RV % pred: 115 %
RV: 2.55 L
TLC % pred: 111 %
TLC: 6.48 L

## 2019-01-26 MED ORDER — IOHEXOL 350 MG/ML SOLN
100.0000 mL | Freq: Once | INTRAVENOUS | Status: AC | PRN
  Administered 2019-01-26: 60 mL via INTRAVENOUS

## 2019-01-26 MED ORDER — ALBUTEROL SULFATE (2.5 MG/3ML) 0.083% IN NEBU
2.5000 mg | INHALATION_SOLUTION | Freq: Once | RESPIRATORY_TRACT | Status: AC
  Administered 2019-01-26: 2.5 mg via RESPIRATORY_TRACT

## 2019-01-26 NOTE — Pre-Procedure Instructions (Signed)
[facility]  01/26/2019      [id number] - [place], [place] - [address] [place] [address] Phone: [phone] Fax: [phone]    Your procedure is scheduled on 01/30/19 Tuesday from 8032-1224 am  Report to [facility], Entrance A  at [place] A.M.  Call this number if you have problems the morning of surgery:  [phone]   Remember:  Do not eat or drink after midnight.     Take these medicines the morning of surgery with A SIP OF WATER              Take last dose of Aspirin on 01/29/19.    Starting today STOP taking any Aspirin (unless otherwise instructed by your surgeon), Aleve, Naproxen, Ibuprofen, Motrin, Advil, Goody's, BC's, all herbal medications, fish oil, and all vitamins.   Do not wear jewelry, make-up or nail polish.  Do not wear lotions, powders, or colognes, or deodorant.  Do not shave 48 hours prior to surgery.  Men may shave face and neck.  Do not bring valuables to the hospital.  [facility] is not responsible for any belongings or valuables.  Contacts, dentures or bridgework may not be worn into surgery.  Leave your suitcase in the car.  After surgery it may be brought to your room.  For patients admitted to the hospital, discharge time will be determined by your treatment team.  Patients discharged the day of surgery will not be allowed to drive home.   Special instructions:  [facility]- Preparing For Surgery  Before surgery, you can play an important role. Because skin is not sterile, your skin needs to be as free of germs as possible. You can reduce the number of germs on your skin by washing with CHG (chlorahexidine gluconate) Soap before surgery.  CHG is an antiseptic cleaner which kills germs and bonds with the skin to continue killing germs even after washing.    Oral Hygiene is also important to reduce your risk of infection.  Remember - BRUSH YOUR TEETH THE MORNING OF SURGERY WITH YOUR REGULAR TOOTHPASTE  Please  do not use if you have an allergy to CHG or antibacterial soaps. If your skin becomes reddened/irritated stop using the CHG.  Do not shave (including legs and underarms) for at least 48 hours prior to first CHG shower. It is OK to shave your face.  Please follow these instructions carefully.   1. Shower the NIGHT BEFORE SURGERY and the MORNING OF SURGERY with CHG.   2. If you chose to wash your hair, wash your hair first as usual with your normal shampoo.  3. After you shampoo, rinse your hair and body thoroughly to remove the shampoo.  4. Use CHG as you would any other liquid soap. You can apply CHG directly to the skin and wash gently with a scrungie or a clean washcloth.   5. Apply the CHG Soap to your body ONLY FROM THE NECK DOWN.  Do not use on open wounds or open sores. Avoid contact with your eyes, ears, mouth and genitals (private parts). Wash Face and genitals (private parts)  with your normal soap.  6. Wash thoroughly, paying special attention to the area where your surgery will be performed.  7. Thoroughly rinse your body with warm water from the neck down.  8. DO NOT shower/wash with your normal soap after using and rinsing off the CHG Soap.  9. Pat yourself dry with a  CLEAN TOWEL.  10. Wear CLEAN PAJAMAS to bed the night before surgery, wear comfortable clothes the morning of surgery  11. Place CLEAN SHEETS on your bed the night of your first shower and DO NOT SLEEP WITH PETS.  Day of Surgery:  Do not apply any deodorants/lotions.  Please wear clean clothes to the hospital/surgery center.   Remember to brush your teeth WITH YOUR REGULAR TOOTHPASTE.  Please read over the following fact sheets that you were given. Pain Booklet, Coughing and Deep Breathing and Surgical Site Infection Prevention

## 2019-01-26 NOTE — Addendum Note (Signed)
Addended by: Carney Living on: 01/26/2019 12:05 PM   Modules accepted: Orders
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
## 2019-01-29 ENCOUNTER — Encounter: Payer: Self-pay | Admitting: Thoracic Surgery (Cardiothoracic Vascular Surgery)

## 2019-01-29 ENCOUNTER — Institutional Professional Consult (permissible substitution) (INDEPENDENT_AMBULATORY_CARE_PROVIDER_SITE_OTHER): Payer: Medicare Other | Admitting: Thoracic Surgery (Cardiothoracic Vascular Surgery)

## 2019-01-29 ENCOUNTER — Encounter (HOSPITAL_COMMUNITY)
Admission: RE | Admit: 2019-01-29 | Discharge: 2019-01-29 | Disposition: A | Discharge status: Home / Self Care | Payer: Medicare Other | Source: Ambulatory Visit | Attending: Cardiovascular Disease | Admitting: Cardiovascular Disease

## 2019-01-29 ENCOUNTER — Ambulatory Visit (HOSPITAL_COMMUNITY)
Admission: RE | Admit: 2019-01-29 | Discharge: 2019-01-29 | Disposition: A | Discharge status: Home / Self Care | Payer: Medicare Other | Source: Ambulatory Visit | Attending: Cardiovascular Disease | Admitting: Cardiovascular Disease

## 2019-01-29 ENCOUNTER — Other Ambulatory Visit: Payer: Self-pay

## 2019-01-29 ENCOUNTER — Encounter (HOSPITAL_COMMUNITY): Payer: Self-pay

## 2019-01-29 VITALS — BP 110/64 | HR 77 | Resp 16 | Ht 64.0 in | Wt 124.0 lb

## 2019-01-29 DIAGNOSIS — Z952 Presence of prosthetic heart valve: Secondary | ICD-10-CM | POA: Diagnosis not present

## 2019-01-29 DIAGNOSIS — I350 Nonrheumatic aortic (valve) stenosis: Secondary | ICD-10-CM

## 2019-01-29 LAB — APTT: aPTT: 39 seconds — ABNORMAL HIGH (ref 24–36)

## 2019-01-29 LAB — SURGICAL PCR SCREEN
MRSA, PCR: NEGATIVE
Staphylococcus aureus: NEGATIVE

## 2019-01-29 LAB — BLOOD GAS, ARTERIAL
ACID-BASE EXCESS: 0.7 mmol/L (ref 0.0–2.0)
Bicarbonate: 24.5 mmol/L (ref 20.0–28.0)
O2 Saturation: 95 %
Patient temperature: 98.6
pCO2 arterial: 37.2 mmHg (ref 32.0–48.0)
pH, Arterial: 7.433 (ref 7.350–7.450)
pO2, Arterial: 70.3 mmHg — ABNORMAL LOW (ref 83.0–108.0)

## 2019-01-29 LAB — CBC
HCT: 42.5 % (ref 39.0–52.0)
Hemoglobin: 14.1 g/dL (ref 13.0–17.0)
MCH: 30.9 pg (ref 26.0–34.0)
MCHC: 33.2 g/dL (ref 30.0–36.0)
MCV: 93.2 fL (ref 80.0–100.0)
PLATELETS: 243 10*3/uL (ref 150–400)
RBC: 4.56 MIL/uL (ref 4.22–5.81)
RDW: 12.5 % (ref 11.5–15.5)
WBC: 11.4 10*3/uL — ABNORMAL HIGH (ref 4.0–10.5)
nRBC: 0 % (ref 0.0–0.2)

## 2019-01-29 LAB — TYPE AND SCREEN
ABO/RH(D): A POS
Antibody Screen: NEGATIVE

## 2019-01-29 LAB — COMPREHENSIVE METABOLIC PANEL
ALT: 13 U/L (ref 0–44)
AST: 19 U/L (ref 15–41)
Albumin: 3.7 g/dL (ref 3.5–5.0)
Alkaline Phosphatase: 81 U/L (ref 38–126)
Anion gap: 7 (ref 5–15)
BUN: 15 mg/dL (ref 8–23)
CO2: 22 mmol/L (ref 22–32)
Calcium: 8.9 mg/dL (ref 8.9–10.3)
Chloride: 107 mmol/L (ref 98–111)
Creatinine, Ser: 1.05 mg/dL (ref 0.61–1.24)
GFR calc Af Amer: >60 mL/min (ref >60)
GFR calc non Af Amer: >60 mL/min (ref >60)
Glucose, Bld: 112 mg/dL — ABNORMAL HIGH (ref 70–99)
Potassium: 4 mmol/L (ref 3.5–5.1)
Sodium: 136 mmol/L (ref 135–145)
Total Bilirubin: 0.7 mg/dL (ref 0.3–1.2)
Total Protein: 6.6 g/dL (ref 6.5–8.1)

## 2019-01-29 LAB — BRAIN NATRIURETIC PEPTIDE: B Natriuretic Peptide: 125.6 pg/mL — ABNORMAL HIGH (ref 0.0–100.0)

## 2019-01-29 LAB — URINALYSIS, ROUTINE W REFLEX MICROSCOPIC
Bilirubin Urine: NEGATIVE
Glucose, UA: NEGATIVE mg/dL
Hgb urine dipstick: NEGATIVE
KETONES UR: NEGATIVE mg/dL
Leukocytes,Ua: NEGATIVE
Nitrite: NEGATIVE
Protein, ur: NEGATIVE mg/dL
Specific Gravity, Urine: 1.005 (ref 1.005–1.030)
pH: 5 (ref 5.0–8.0)

## 2019-01-29 LAB — ABO/RH: ABO/RH(D): A POS

## 2019-01-29 LAB — PROTIME-INR
INR: 1.1 (ref 0.8–1.2)
Prothrombin Time: 13.6 seconds (ref 11.4–15.2)

## 2019-01-29 LAB — HEMOGLOBIN A1C
HEMOGLOBIN A1C: 5.5 % (ref 4.8–5.6)
MEAN PLASMA GLUCOSE: 111.15 mg/dL

## 2019-01-29 MED ORDER — DEXMEDETOMIDINE HCL IN NACL 400 MCG/100ML IV SOLN
0.1000 ug/kg/h | INTRAVENOUS | Status: AC
  Administered 2019-01-30: 1 ug/kg/h via INTRAVENOUS
  Filled 2019-01-29: qty 100

## 2019-01-29 MED ORDER — NOREPINEPHRINE 4 MG/250ML-% IV SOLN
0.0000 ug/min | INTRAVENOUS | Status: DC
  Filled 2019-01-29: qty 250

## 2019-01-29 MED ORDER — SODIUM CHLORIDE 0.9 % IV SOLN
1.5000 g | INTRAVENOUS | Status: AC
  Administered 2019-01-30: 1.5 g via INTRAVENOUS
  Filled 2019-01-29: qty 1.5

## 2019-01-29 MED ORDER — POTASSIUM CHLORIDE 2 MEQ/ML IV SOLN
80.0000 meq | INTRAVENOUS | Status: DC
  Filled 2019-01-29: qty 40

## 2019-01-29 MED ORDER — MAGNESIUM SULFATE 50 % IJ SOLN
40.0000 meq | INTRAMUSCULAR | Status: DC
  Filled 2019-01-29: qty 9.85

## 2019-01-29 MED ORDER — VANCOMYCIN HCL 10 G IV SOLR
1250.0000 mg | INTRAVENOUS | Status: AC
  Administered 2019-01-30: 1250 mg via INTRAVENOUS
  Filled 2019-01-29: qty 1250

## 2019-01-29 MED ORDER — SODIUM CHLORIDE 0.9 % IV SOLN
INTRAVENOUS | Status: DC
  Filled 2019-01-29: qty 30
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
## 2019-01-29 NOTE — Progress Notes (Signed)
PCP - Lamonte Sakai, MD Cardiologist - Dr. Debara Pickett  Chest x-ray - 01/29/19 EKG - 01/10/19 Stress Test - 11/171/16 C.E ECHO - 01/10/19 Cardiac Cath - 01/12/19  Pt with Cardiac Event Monitor in place. Called Jeneen Rinks to find out if patient needs any instructions before procedure tomorrow- and if would interfere with today's CXR. Jeneen Rinks contacted Nell Range- PA-C with TAVR team and was told to advise patient he could remove today- and send back to the company- that he no longer needed to wear. Pt aware, ZIO patch removed. Patient placed in bag and took home to return.   Sleep Study - denies    Aspirin Instructions: last dose of ASA 01/29/19. Patient instructed to hold all  NSAID's, herbal medications, fish oil and vitamins 7 days prior to surgery.   Anesthesia review: cardiac history.   Patient denies shortness of breath, fever, cough and chest pain at PAT appointment   Patient verbalized understanding of instructions that were given to them at the PAT appointment. Patient was also instructed that they will need to review over the PAT instructions again at home before surgery.

## 2019-01-29 NOTE — Anesthesia Preprocedure Evaluation (Addendum)
Anesthesia Evaluation  Patient identified by MRN, date of birth, ID band Patient awake    Reviewed: Allergy & Precautions, NPO status , Patient's Chart, lab work & pertinent test results  Airway Mallampati: II  TM Distance: >3 FB Neck ROM: Full    Dental  (+) Dental Advisory Given, Edentulous Lower, Upper Dentures   Pulmonary Current Smoker,    Pulmonary exam normal breath sounds clear to auscultation       Cardiovascular + CAD and + Past MI  + Valvular Problems/Murmurs AS  Rhythm:Regular Rate:Normal + Systolic murmurs Echo 2/35/3614:  1. The aortic valve has an indeterminate number of cusps Moderate thickening of the aortic valve Severe calcifcation of the aortic valve. severe stenosis of the aortic valve.  2. The left ventricle has normal systolic function with an ejection fraction of 60-65%. The cavity size was normal. Left ventricular diastolic Doppler parameters are consistent with impaired relaxation. No evidence of left ventricular regional wall  motion abnormalities.  3. The right ventricle has normal systolic function. The cavity was normal. There is no increase in right ventricular wall thickness.  4. Left atrial size was mildly dilated.  5. The tricuspid valve is grossly normal.   Neuro/Psych negative neurological ROS     GI/Hepatic negative GI ROS, Neg liver ROS,   Endo/Other  negative endocrine ROS  Renal/GU negative Renal ROS Bladder dysfunction      Musculoskeletal negative musculoskeletal ROS (+)   Abdominal   Peds  Hematology negative hematology ROS (+)   Anesthesia Other Findings Day of surgery medications reviewed with the patient.  Reproductive/Obstetrics                            Anesthesia Physical Anesthesia Plan  ASA: IV  Anesthesia Plan: MAC   Post-op Pain Management:    Induction: Intravenous  PONV Risk Score and Plan: 0 and Treatment may vary due to age  or medical condition  Airway Management Planned: Natural Airway and Simple Face Mask  Additional Equipment: Arterial line  Intra-op Plan:   Post-operative Plan:   Informed Consent: I have reviewed the patients History and Physical, chart, labs and discussed the procedure including the risks, benefits and alternatives for the proposed anesthesia with the patient or authorized representative who has indicated his/her understanding and acceptance.     Dental advisory given  Plan Discussed with: CRNA  Anesthesia Plan Comments:        Anesthesia Quick Evaluation

## 2019-01-29 NOTE — Patient Instructions (Addendum)
Stop smoking immediately and permanently.  Have nothing to eat or drink after midnight.  On the morning of surgery do not take any of your regular medications

## 2019-01-30 ENCOUNTER — Inpatient Hospital Stay (HOSPITAL_COMMUNITY): Payer: Medicare Other | Admitting: Physician Assistant

## 2019-01-30 ENCOUNTER — Encounter (HOSPITAL_COMMUNITY)
Admission: RE | Disposition: A | Discharge status: Home / Self Care | Payer: Self-pay | Source: Home / Self Care | Attending: Cardiovascular Disease

## 2019-01-30 ENCOUNTER — Encounter (HOSPITAL_COMMUNITY): Payer: Self-pay | Admitting: *Deleted

## 2019-01-30 ENCOUNTER — Inpatient Hospital Stay (HOSPITAL_COMMUNITY): Payer: Medicare Other

## 2019-01-30 ENCOUNTER — Inpatient Hospital Stay (HOSPITAL_COMMUNITY): Payer: Medicare Other | Admitting: Anesthesiology

## 2019-01-30 ENCOUNTER — Other Ambulatory Visit: Payer: Self-pay | Admitting: Physician Assistant

## 2019-01-30 ENCOUNTER — Inpatient Hospital Stay (HOSPITAL_COMMUNITY)
Admission: RE | Admit: 2019-01-30 | Discharge: 2019-01-31 | DRG: 267 | Disposition: A | Discharge status: Home / Self Care | Payer: Medicare Other | Attending: Cardiovascular Disease | Admitting: Cardiovascular Disease

## 2019-01-30 DIAGNOSIS — I252 Old myocardial infarction: Secondary | ICD-10-CM | POA: Diagnosis not present

## 2019-01-30 DIAGNOSIS — Z79899 Other long term (current) drug therapy: Secondary | ICD-10-CM

## 2019-01-30 DIAGNOSIS — N401 Enlarged prostate with lower urinary tract symptoms: Secondary | ICD-10-CM | POA: Diagnosis present

## 2019-01-30 DIAGNOSIS — Z952 Presence of prosthetic heart valve: Secondary | ICD-10-CM

## 2019-01-30 DIAGNOSIS — I2510 Atherosclerotic heart disease of native coronary artery without angina pectoris: Secondary | ICD-10-CM | POA: Diagnosis present

## 2019-01-30 DIAGNOSIS — Z8249 Family history of ischemic heart disease and other diseases of the circulatory system: Secondary | ICD-10-CM | POA: Diagnosis not present

## 2019-01-30 DIAGNOSIS — Z006 Encounter for examination for normal comparison and control in clinical research program: Secondary | ICD-10-CM

## 2019-01-30 DIAGNOSIS — F17210 Nicotine dependence, cigarettes, uncomplicated: Secondary | ICD-10-CM | POA: Diagnosis present

## 2019-01-30 DIAGNOSIS — Z7982 Long term (current) use of aspirin: Secondary | ICD-10-CM

## 2019-01-30 DIAGNOSIS — I350 Nonrheumatic aortic (valve) stenosis: Secondary | ICD-10-CM | POA: Diagnosis present

## 2019-01-30 DIAGNOSIS — Z720 Tobacco use: Secondary | ICD-10-CM | POA: Diagnosis present

## 2019-01-30 DIAGNOSIS — Z833 Family history of diabetes mellitus: Secondary | ICD-10-CM

## 2019-01-30 DIAGNOSIS — N529 Male erectile dysfunction, unspecified: Secondary | ICD-10-CM | POA: Diagnosis present

## 2019-01-30 DIAGNOSIS — Z953 Presence of xenogenic heart valve: Secondary | ICD-10-CM

## 2019-01-30 DIAGNOSIS — R55 Syncope and collapse: Secondary | ICD-10-CM | POA: Diagnosis present

## 2019-01-30 HISTORY — DX: Presence of prosthetic heart valve: Z95.2

## 2019-01-30 HISTORY — PX: TEE WITHOUT CARDIOVERSION: SHX5443

## 2019-01-30 HISTORY — PX: TRANSCATHETER AORTIC VALVE REPLACEMENT, TRANSFEMORAL: SHX6400

## 2019-01-30 LAB — POCT I-STAT 4, (NA,K, GLUC, HGB,HCT)
Glucose, Bld: 112 mg/dL — ABNORMAL HIGH (ref 70–99)
Glucose, Bld: 101 mg/dL — ABNORMAL HIGH (ref 70–99)
HCT: 34 % — ABNORMAL LOW (ref 39.0–52.0)
HCT: 33 % — ABNORMAL LOW (ref 39.0–52.0)
Hemoglobin: 11.6 g/dL — ABNORMAL LOW (ref 13.0–17.0)
Hemoglobin: 11.2 g/dL — ABNORMAL LOW (ref 13.0–17.0)
POTASSIUM: 4 mmol/L (ref 3.5–5.1)
Potassium: 4 mmol/L (ref 3.5–5.1)
Sodium: 142 mmol/L (ref 135–145)
Sodium: 141 mmol/L (ref 135–145)

## 2019-01-30 LAB — POCT I-STAT 7, (LYTES, BLD GAS, ICA,H+H)
Acid-base deficit: 2 mmol/L (ref 0.0–2.0)
Bicarbonate: 24.4 mmol/L (ref 20.0–28.0)
Calcium, Ion: 1.18 mmol/L (ref 1.15–1.40)
HCT: 34 % — ABNORMAL LOW (ref 39.0–52.0)
Hemoglobin: 11.6 g/dL — ABNORMAL LOW (ref 13.0–17.0)
O2 Saturation: 95 %
PCO2 ART: 45.4 mmHg (ref 32.0–48.0)
Potassium: 4.2 mmol/L (ref 3.5–5.1)
Sodium: 141 mmol/L (ref 135–145)
TCO2: 26 mmol/L (ref 22–32)
pH, Arterial: 7.339 — ABNORMAL LOW (ref 7.350–7.450)
pO2, Arterial: 78 mmHg — ABNORMAL LOW (ref 83.0–108.0)

## 2019-01-30 LAB — POCT I-STAT CREATININE: Creatinine, Ser: 0.9 mg/dL (ref 0.61–1.24)

## 2019-01-30 LAB — POCT ACTIVATED CLOTTING TIME
Activated Clotting Time: 142 seconds
Activated Clotting Time: 131 seconds
Activated Clotting Time: 279 seconds

## 2019-01-30 SURGERY — IMPLANTATION, AORTIC VALVE, TRANSCATHETER, FEMORAL APPROACH
Anesthesia: Monitor Anesthesia Care

## 2019-01-30 MED ORDER — LACTATED RINGERS IV SOLN
INTRAVENOUS | Status: DC | PRN
  Administered 2019-01-30: 07:00:00 via INTRAVENOUS

## 2019-01-30 MED ORDER — SODIUM CHLORIDE 0.9 % IV SOLN
INTRAVENOUS | Status: DC | PRN
  Administered 2019-01-30: 07:00:00 via INTRAVENOUS

## 2019-01-30 MED ORDER — IOHEXOL 350 MG/ML SOLN
INTRAVENOUS | Status: DC | PRN
  Administered 2019-01-30: 45 mL via INTRAVENOUS

## 2019-01-30 MED ORDER — SODIUM CHLORIDE 0.9 % IV SOLN: 250.0000 mL | INTRAVENOUS | Status: DC | PRN

## 2019-01-30 MED ORDER — NITROGLYCERIN IN D5W 200-5 MCG/ML-% IV SOLN: 0.0000 ug/min | INTRAVENOUS | Status: DC

## 2019-01-30 MED ORDER — METOPROLOL TARTRATE 5 MG/5ML IV SOLN: 2.5000 mg | INTRAVENOUS | Status: DC | PRN

## 2019-01-30 MED ORDER — HEPARIN SODIUM (PORCINE) 1000 UNIT/ML IJ SOLN
INTRAMUSCULAR | Status: DC | PRN
  Administered 2019-01-30: 9000 [IU] via INTRAVENOUS

## 2019-01-30 MED ORDER — PHENYLEPHRINE HCL-NACL 20-0.9 MG/250ML-% IV SOLN: 0.0000 ug/min | INTRAVENOUS | Status: DC

## 2019-01-30 MED ORDER — CHLORHEXIDINE GLUCONATE 0.12 % MT SOLN
15.0000 mL | Freq: Once | OROMUCOSAL | Status: AC
  Administered 2019-01-30: 15 mL via OROMUCOSAL
  Filled 2019-01-30 (×2): qty 15

## 2019-01-30 MED ORDER — VANCOMYCIN HCL IN DEXTROSE 1-5 GM/200ML-% IV SOLN
1000.0000 mg | Freq: Once | INTRAVENOUS | Status: DC
  Filled 2019-01-30: qty 200

## 2019-01-30 MED ORDER — CHLORHEXIDINE GLUCONATE 4 % EX LIQD: 60.0000 mL | Freq: Once | CUTANEOUS | Status: DC

## 2019-01-30 MED ORDER — MORPHINE SULFATE (PF) 2 MG/ML IV SOLN: 1.0000 mg | INTRAVENOUS | Status: DC | PRN

## 2019-01-30 MED ORDER — PROTAMINE SULFATE 10 MG/ML IV SOLN
INTRAVENOUS | Status: DC | PRN
  Administered 2019-01-30: 90 mg via INTRAVENOUS

## 2019-01-30 MED ORDER — SODIUM CHLORIDE 0.9% FLUSH: 3.0000 mL | Freq: Two times a day (BID) | INTRAVENOUS | Status: DC

## 2019-01-30 MED ORDER — HEPARIN (PORCINE) IN NACL 1000-0.9 UT/500ML-% IV SOLN
INTRAVENOUS | Status: DC | PRN
  Administered 2019-01-30 (×3): 500 mL

## 2019-01-30 MED ORDER — PROPOFOL 500 MG/50ML IV EMUL
INTRAVENOUS | Status: DC | PRN
  Administered 2019-01-30: 10 ug/kg/min via INTRAVENOUS

## 2019-01-30 MED ORDER — ASPIRIN EC 81 MG PO TBEC
81.0000 mg | DELAYED_RELEASE_TABLET | Freq: Every day | ORAL | Status: DC
  Administered 2019-01-31: 81 mg via ORAL
  Filled 2019-01-30: qty 1

## 2019-01-30 MED ORDER — FENTANYL CITRATE (PF) 100 MCG/2ML IJ SOLN
INTRAMUSCULAR | Status: DC | PRN
  Administered 2019-01-30: 50 ug via INTRAVENOUS

## 2019-01-30 MED ORDER — DEXMEDETOMIDINE HCL 200 MCG/2ML IV SOLN
INTRAVENOUS | Status: DC | PRN
  Administered 2019-01-30: 56.2 ug via INTRAVENOUS

## 2019-01-30 MED ORDER — LIDOCAINE HCL (PF) 1 % IJ SOLN
INTRAMUSCULAR | Status: DC | PRN
  Administered 2019-01-30 (×2): 10 mL via INTRADERMAL

## 2019-01-30 MED ORDER — CLOPIDOGREL BISULFATE 75 MG PO TABS
75.0000 mg | ORAL_TABLET | Freq: Every day | ORAL | Status: DC
  Administered 2019-01-31: 09:00:00 75 mg via ORAL
  Filled 2019-01-30: qty 1

## 2019-01-30 MED ORDER — ONDANSETRON HCL 4 MG/2ML IJ SOLN: 4.0000 mg | Freq: Four times a day (QID) | INTRAMUSCULAR | Status: DC | PRN

## 2019-01-30 MED ORDER — TRAMADOL HCL 50 MG PO TABS: 50.0000 mg | ORAL_TABLET | ORAL | Status: DC | PRN

## 2019-01-30 MED ORDER — SODIUM CHLORIDE 0.9 % IV SOLN
INTRAVENOUS | Status: AC
  Administered 2019-01-30: 50 mL/h via INTRAVENOUS

## 2019-01-30 MED ORDER — VANCOMYCIN HCL 1000 MG IV SOLR
1000.0000 mg | Freq: Once | INTRAVENOUS | Status: AC
  Administered 2019-01-30: 1000 mg via INTRAVENOUS
  Filled 2019-01-30: qty 1000

## 2019-01-30 MED ORDER — ONDANSETRON HCL 4 MG/2ML IJ SOLN
INTRAMUSCULAR | Status: DC | PRN
  Administered 2019-01-30: 4 mg via INTRAVENOUS

## 2019-01-30 MED ORDER — SODIUM CHLORIDE 0.9% FLUSH: 3.0000 mL | INTRAVENOUS | Status: DC | PRN

## 2019-01-30 MED ORDER — CHLORHEXIDINE GLUCONATE 4 % EX LIQD
30.0000 mL | CUTANEOUS | Status: DC
  Filled 2019-01-30: qty 30

## 2019-01-30 MED ORDER — ACETAMINOPHEN 650 MG RE SUPP: 650.0000 mg | Freq: Four times a day (QID) | RECTAL | Status: DC | PRN

## 2019-01-30 MED ORDER — SODIUM CHLORIDE 0.9 % IV SOLN: INTRAVENOUS | Status: DC

## 2019-01-30 MED ORDER — ACETAMINOPHEN 325 MG PO TABS
650.0000 mg | ORAL_TABLET | Freq: Four times a day (QID) | ORAL | Status: DC | PRN
  Administered 2019-01-31: 650 mg via ORAL
  Filled 2019-01-30: qty 2

## 2019-01-30 MED ORDER — OXYCODONE HCL 5 MG PO TABS: 5.0000 mg | ORAL_TABLET | ORAL | Status: DC | PRN

## 2019-01-30 MED ORDER — EPHEDRINE SULFATE 50 MG/ML IJ SOLN
INTRAMUSCULAR | Status: DC | PRN
  Administered 2019-01-30: 5 mg via INTRAVENOUS

## 2019-01-30 MED ORDER — TAMSULOSIN HCL 0.4 MG PO CAPS
0.4000 mg | ORAL_CAPSULE | Freq: Every day | ORAL | Status: DC
  Administered 2019-01-31: 0.4 mg via ORAL
  Filled 2019-01-30: qty 1

## 2019-01-30 MED ORDER — SODIUM CHLORIDE 0.9 % IV SOLN
1.5000 g | Freq: Two times a day (BID) | INTRAVENOUS | Status: DC
  Administered 2019-01-30 – 2019-01-31 (×2): 1.5 g via INTRAVENOUS
  Filled 2019-01-30 (×4): qty 1.5

## 2019-01-30 SURGICAL SUPPLY — 35 items
BAG SNAP BAND KOVER 36X36 (MISCELLANEOUS) ×6 IMPLANT
BLANKET WARM UNDERBOD FULL ACC (MISCELLANEOUS) ×3 IMPLANT
CABLE ADAPT PACING TEMP 12FT (ADAPTER) ×3 IMPLANT
CABLE SURGICAL S-101-97-12 (CABLE) ×3 IMPLANT
CATH 23 EDWARDS DELIVERY SYS (CATHETERS) ×3 IMPLANT
CATH DIAG 6FR PIGTAIL ANGLED (CATHETERS) ×6 IMPLANT
CATH INFINITI 6F AL2 (CATHETERS) ×3 IMPLANT
CATH S G BIP PACING (CATHETERS) ×3 IMPLANT
CLOSURE MYNX CONTROL 6F/7F (Vascular Products) ×3 IMPLANT
CRIMPER (MISCELLANEOUS) ×3 IMPLANT
DEVICE CLOSURE PERCLS PRGLD 6F (VASCULAR PRODUCTS) ×2 IMPLANT
DEVICE INFLATION ATRION QL2530 (MISCELLANEOUS) ×3 IMPLANT
ELECT DEFIB PAD ADLT CADENCE (PAD) ×3 IMPLANT
GUIDEWIRE SAF TJ AMPL .035X180 (WIRE) ×3 IMPLANT
GUIDEWIRE SAFE TJ AMPLATZ EXST (WIRE) ×3 IMPLANT
KIT HEART LEFT (KITS) ×3 IMPLANT
KIT MICROPUNCTURE NIT STIFF (SHEATH) ×3 IMPLANT
PACK CARDIAC CATHETERIZATION (CUSTOM PROCEDURE TRAY) ×3 IMPLANT
PERCLOSE PROGLIDE 6F (VASCULAR PRODUCTS) ×6
SHEATH 14X36 EDWARDS (SHEATH) ×3 IMPLANT
SHEATH BRITE TIP 7FR 35CM (SHEATH) ×3 IMPLANT
SHEATH PINNACLE 6F 10CM (SHEATH) ×3 IMPLANT
SHEATH PINNACLE 8F 10CM (SHEATH) ×3 IMPLANT
SHEATH PROBE COVER 6X72 (BAG) ×6 IMPLANT
SLEEVE REPOSITIONING LENGTH 30 (MISCELLANEOUS) ×3 IMPLANT
STOPCOCK MORSE 400PSI 3WAY (MISCELLANEOUS) ×6 IMPLANT
SYR MEDRAD MARK V 150ML (SYRINGE) ×3 IMPLANT
TRANSDUCER W/STOPCOCK (MISCELLANEOUS) ×6 IMPLANT
TUBE CONN 8.8X1320 FR HP M-F (CONNECTOR) ×3 IMPLANT
VALVE HEART TRANSCATH SZ3 23MM (Valve) ×3 IMPLANT
WIRE AMPLATZ SS-J .035X180CM (WIRE) ×3 IMPLANT
WIRE EMERALD 3MM-J .035X150CM (WIRE) ×3 IMPLANT
WIRE EMERALD 3MM-J .035X260CM (WIRE) ×3 IMPLANT
WIRE EMERALD ST .035X260CM (WIRE) ×3 IMPLANT
WIRE MICROINTRODUCER 60CM (WIRE) ×3 IMPLANT

## 2019-01-30 NOTE — Progress Notes (Signed)
TCTS BRIEF PROGRESS NOTE  Day of Surgery  S/P Procedure(s) (LRB): TRANSCATHETER AORTIC VALVE REPLACEMENT, TRANSFEMORAL (N/A) TRANSESOPHAGEAL ECHOCARDIOGRAM (TEE) (N/A)   Doing very well Ambulating in halls NSR w/ stable BP Both groins look good  Plan: Routine post-TAVR  Rexene Alberts, MD 01/30/2019 4:41 PM
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
## 2019-01-30 NOTE — Transfer of Care (Signed)
Immediate Anesthesia Transfer of Care Note  Patient: Michael Edwards  Procedure(s) Performed: TRANSCATHETER AORTIC VALVE REPLACEMENT, TRANSFEMORAL (N/A ) TRANSESOPHAGEAL ECHOCARDIOGRAM (TEE) (N/A )  Patient Location: PACU  Anesthesia Type:MAC  Level of Consciousness: awake, alert  and oriented  Airway & Oxygen Therapy: Patient Spontanous Breathing  Post-op Assessment: Report given to RN and Post -op Vital signs reviewed and stable  Post vital signs: Reviewed and stable  Last Vitals:  Vitals Value Taken Time  BP 125/61 01/30/2019  9:26 AM  Temp    Pulse 59 01/30/2019  9:25 AM  Resp 13 01/30/2019  9:25 AM  SpO2 97 % 01/30/2019  9:25 AM  Vitals shown include unvalidated device data.  Last Pain:  Vitals:   01/30/19 0905  TempSrc:   PainSc: 0-No pain         Complications: No apparent anesthesia complications
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
## 2019-01-30 NOTE — Anesthesia Procedure Notes (Signed)
Procedure Name: MAC Date/Time: 01/30/2019 7:32 AM Performed by: Inda Coke, CRNA Pre-anesthesia Checklist: Patient identified, Emergency Drugs available, Suction available, Timeout performed and Patient being monitored Patient Re-evaluated:Patient Re-evaluated prior to induction Oxygen Delivery Method: Simple face mask Induction Type: IV induction Dental Injury: Teeth and Oropharynx as per pre-operative assessment

## 2019-01-30 NOTE — Discharge Instructions (Signed)
ACTIVITY AND EXERCISE °• Daily activity and exercise are an important part of your recovery. People recover at different rates depending on their general health and type of valve procedure. °• Most people recovering from TAVR feel better relatively quickly  °• No lifting, pushing, pulling more than 10 pounds (examples to avoid: groceries, vacuuming, gardening, golfing): °            - For one week with a procedure through the groin. °            - For six weeks for procedures through the chest wall or neck °NOTE: You will typically see one of our providers 7-14 days after your procedure to discuss WHEN TO RESUME the above activities.  °  °  °DRIVING °• Do not drive for until you are seen for follow up and cleared by a provider. Generally, we ask patient to not drive for 1 week after their procedure. °• If you have been told by your doctor in the past that you may not drive, you must talk with him/her before you begin driving again. °  °  °DRESSING °• Groin site: you may leave the clear dressing over the site for up to one week or until it falls off. °  °  °HYGIENE °• If you had a femoral (leg) procedure, you may take a shower when you return home. After the shower, pat the site dry. Do NOT use powder, oils or lotions in your groin area until the site has completely healed. °• If you had a chest procedure, you may shower when you return home unless specifically instructed not to by your discharging practitioner. °            - DO NOT scrub incision; pat dry with a towel °            - DO NOT apply any lotions, oils, powders to the incision °            - No tub baths / swimming for at least 2 weeks. °• If you notice any fevers, chills, increased pain, swelling, bleeding or pus, please contact your doctor. °  °ADDITIONAL INFORMATION °• If you are going to have an upcoming dental procedure, please contact our office as you will require antibiotics ahead of time to prevent infection on your heart valve.  ° ° °If you  have any questions or concerns you can call the structural heart phone during normal business hours 8am-4pm. If you have an urgent need after hours or weekends please call [phone] to talk to the on call provider for general cardiology. If you have an emergency that requires immediate attention, please call 911.  ° ° °After TAVR Checklist ° °Check  Test Description  ° Follow up appointment in 1-2 weeks  You will see our structural heart physician assistant, Michael Edwards. Your incision sites will be checked and you will be cleared to drive and resume all normal activities if you are doing well.    ° 1 month echo and follow up  You will have an echo to check on your new heart valve and be seen back in the office by Michael Edwards. Many times the echo is not read by your appointment time, but Michael will call you later that day or the following day to report your results.  ° Follow up with your primary cardiologist You will need to be seen by your primary cardiologist in the following 3-6 months after your 1   month appointment in the valve clinic. Often times your Plavix or Aspirin will be discontinued during this time, but this is decided on a case by case basis.    1 year echo and follow up You will have another echo to check on your heart valve after 1 year and be seen back in the office by Michael Edwards. This your last structural heart visit.   Bacterial endocarditis prophylaxis  You will have to take antibiotics for the rest of your life before all dental procedures (even teeth cleanings) to protect your heart valve. Antibiotics are also required before some surgeries. Please check with your cardiologist before scheduling any surgeries. Also, please make sure to tell us if you have a penicillin allergy as you will require an alternative antibiotic.      YOUR CARDIOLOGY TEAM HAS ARRANGED FOR AN E-VISIT FOR YOUR APPOINTMENT - PLEASE REVIEW IMPORTANT INFORMATION BELOW SEVERAL DAYS PRIOR TO YOUR  APPOINTMENT  Due to the recent COVID-19 pandemic, we are transitioning in-person office visits to tele-medicine visits in an effort to decrease unnecessary exposure to our patients and staff. Medicare and most insurances are covering these visits without a copay needed. We also encourage you to sign up for MyChart if you have not already done so. You will need a smartphone if possible. For patients that do not have this, we can still complete the visit using a regular telephone but do prefer a smartphone to enable video when possible. You may have a close family member that lives with you that can help. If possible, we also ask that you have a blood pressure cuff and scale at home to measure your blood pressure, heart rate and weight prior to your scheduled appointment. Patients with clinical needs that need an in-person evaluation and testing will still be able to come to the office if absolutely necessary. If you have any questions, feel free to call our office.    IF YOU HAVE A SMARTPHONE, PLEASE DOWNLOAD THE WEBEX APP TO YOUR SMARTPHONE  - If Apple, go to [employer] and type in WebEx in the search bar. [place] [employer], the blue/green circle. The app is free but as with any other app download, your phone may require you to verify saved payment information or Apple password. You do NOT have to create a WebEx account.  - If Android, go to [employer] and type in [employer] in the search bar. [place] [employer], the blue/green circle. The app is free but as with any other app download, your phone may require you to verify saved payment information or Android password. You do NOT have to create a WebEx account.  It is very helpful to have this downloaded before your visit.    2-3 DAYS BEFORE YOUR APPOINTMENT  You will receive a telephone call from one of our [place] team members - your caller ID may say "Unknown caller." If this is a video visit, we will confirm that you  have been able to download the WebEx app. We will remind you check your blood pressure, heart rate and weight prior to your scheduled appointment. If you have an Apple Watch or Kardia, please upload any pertinent ECG strips the day before or morning of your appointment to [place]. Our staff will also make sure you have reviewed the consent and agree to move forward with your scheduled tele-health visit.     THE DAY OF YOUR APPOINTMENT  Approximately 15 minutes prior to your scheduled  appointment, you will receive a telephone call from one of [facility] team - your caller ID may say "Unknown caller."  Our staff will confirm medications, vital signs for the day and any symptoms you may be experiencing. Please have this information available prior to the time of visit start. It may also be helpful for you to have a pad of paper and pen handy for any instructions given during your visit. They will also walk you through joining the WebEx smartphone meeting if this is a video visit.    CONSENT FOR TELE-HEALTH VISIT - PLEASE RVIEW  I hereby voluntarily request, consent and authorize [facility] and its employed or contracted physicians, physician assistants, nurse practitioners or other licensed health care professionals (the Practitioner), to provide me with telemedicine health care services (the Services") as deemed necessary by the treating Practitioner. I acknowledge and consent to receive the Services by the Practitioner via telemedicine. I understand that the telemedicine visit will involve communicating with the Practitioner through live audiovisual communication technology and the disclosure of certain medical information by electronic transmission. I acknowledge that I have been given the opportunity to request an in-person assessment or other available alternative prior to the telemedicine visit and am voluntarily participating in the telemedicine visit.  I understand that I have the right to  withhold or withdraw my consent to the use of telemedicine in the course of my care at any time, without affecting my right to future care or treatment, and that the Practitioner or I may terminate the telemedicine visit at any time. I understand that I have the right to inspect all information obtained and/or recorded in the course of the telemedicine visit and may receive copies of available information for a reasonable fee.  I understand that some of the potential risks of receiving the Services via telemedicine include:   Delay or interruption in medical evaluation due to technological equipment failure or disruption;  Information transmitted may not be sufficient (e.g. poor resolution of images) to allow for appropriate medical decision making by the Practitioner; and/or   In rare instances, security protocols could fail, causing a breach of personal health information.  Furthermore, I acknowledge that it is my responsibility to provide information about my medical history, conditions and care that is complete and accurate to the best of my ability. I acknowledge that Practitioner's advice, recommendations, and/or decision may be based on factors not within their control, such as incomplete or inaccurate data provided by me or distortions of diagnostic images or specimens that may result from electronic transmissions. I understand that the practice of medicine is not an exact science and that Practitioner makes no warranties or guarantees regarding treatment outcomes. I acknowledge that I will receive a copy of this consent concurrently upon execution via email to the email address I last provided but may also request a printed copy by calling the office of [facility].    I understand that my insurance will be billed for this visit.   I have read or had this consent read to me.  I understand the contents of this consent, which adequately explains the benefits and risks of the Services being  provided via telemedicine.   I have been provided ample opportunity to ask questions regarding this consent and the Services and have had my questions answered to my satisfaction.  I give my informed consent for the services to be provided through the use of telemedicine in my medical care  By participating in this  telemedicine visit I agree to the above.

## 2019-01-30 NOTE — Progress Notes (Signed)
  Echocardiogram 2D Echocardiogram has been performed.  Michael Edwards 01/30/2019, 11:39 AM

## 2019-01-30 NOTE — Progress Notes (Signed)
Pt received from PACU. Pt telebox applied/CCMD notified. CHG bath given. Vitals stable. Pt denies complaints. Pt agrees to bedrest until 13:15 hrs. Pt oriented to room and call bell. Will continue to monitor. Jerald Kief, RN

## 2019-01-30 NOTE — Anesthesia Postprocedure Evaluation (Signed)
Anesthesia Post Note  Patient: Michael Edwards  Procedure(s) Performed: TRANSCATHETER AORTIC VALVE REPLACEMENT, TRANSFEMORAL (N/A ) TRANSESOPHAGEAL ECHOCARDIOGRAM (TEE) (N/A )     Patient location during evaluation: Cath Lab Anesthesia Type: MAC Level of consciousness: awake and alert, awake and oriented Pain management: pain level controlled Vital Signs Assessment: post-procedure vital signs reviewed and stable Respiratory status: spontaneous breathing, nonlabored ventilation, respiratory function stable and patient connected to nasal cannula oxygen Cardiovascular status: stable and blood pressure returned to baseline Postop Assessment: no apparent nausea or vomiting Anesthetic complications: no    Last Vitals:  Vitals:   01/30/19 1025 01/30/19 1035  BP: (!) 111/51   Pulse: (!) 51 (!) 49  Resp: 13 12  Temp:  (!) 36.3 C  SpO2: 98% 98%    Last Pain:  Vitals:   01/30/19 0905  TempSrc:   PainSc: 0-No pain                 Catalina Gravel

## 2019-01-30 NOTE — Anesthesia Procedure Notes (Signed)
Arterial Line Insertion Start/End3/31/2020 6:30 AM, 01/30/2019 6:44 AM Performed by: Josephine Igo, CRNA, CRNA  Patient location: Pre-op. Lidocaine 1% used for infiltration Right, radial was placed Catheter size: 20 G Hand hygiene performed , maximum sterile barriers used  and Seldinger technique used  Attempts: 1 Procedure performed without using ultrasound guided technique. Following insertion, dressing applied and Biopatch. Post procedure assessment: normal  Patient tolerated the procedure well with no immediate complications.

## 2019-01-30 NOTE — Interval H&P Note (Signed)
History and Physical Interval Note:  01/30/2019 5:47 AM  Michael Edwards  has presented today for surgery, with the diagnosis of Severe Aortic Stenosis, Syncope.  The various methods of treatment have been discussed with the patient and family. After consideration of risks, benefits and other options for treatment, the patient has consented to  Procedure(s): TRANSCATHETER AORTIC VALVE REPLACEMENT, TRANSFEMORAL (N/A) TRANSESOPHAGEAL ECHOCARDIOGRAM (TEE) (N/A) as a surgical intervention.  The patient's history has been reviewed, patient examined, no change in status, stable for surgery.  I have reviewed the patient's chart and labs.  Questions were answered to the patient's satisfaction.     Rexene Alberts

## 2019-01-31 ENCOUNTER — Encounter (HOSPITAL_COMMUNITY): Payer: Self-pay | Admitting: Physician Assistant

## 2019-01-31 ENCOUNTER — Inpatient Hospital Stay (HOSPITAL_COMMUNITY): Payer: Medicare Other

## 2019-01-31 DIAGNOSIS — Z720 Tobacco use: Secondary | ICD-10-CM | POA: Diagnosis present

## 2019-01-31 DIAGNOSIS — I350 Nonrheumatic aortic (valve) stenosis: Principal | ICD-10-CM

## 2019-01-31 DIAGNOSIS — Z952 Presence of prosthetic heart valve: Secondary | ICD-10-CM

## 2019-01-31 DIAGNOSIS — I2510 Atherosclerotic heart disease of native coronary artery without angina pectoris: Secondary | ICD-10-CM | POA: Diagnosis present

## 2019-01-31 LAB — BASIC METABOLIC PANEL
Anion gap: 6 (ref 5–15)
BUN: 10 mg/dL (ref 8–23)
CO2: 22 mmol/L (ref 22–32)
Calcium: 8 mg/dL — ABNORMAL LOW (ref 8.9–10.3)
Chloride: 108 mmol/L (ref 98–111)
Creatinine, Ser: 1.05 mg/dL (ref 0.61–1.24)
GFR calc Af Amer: >60 mL/min (ref >60)
GFR calc non Af Amer: >60 mL/min (ref >60)
Glucose, Bld: 120 mg/dL — ABNORMAL HIGH (ref 70–99)
Potassium: 3.5 mmol/L (ref 3.5–5.1)
SODIUM: 136 mmol/L (ref 135–145)

## 2019-01-31 LAB — ECHOCARDIOGRAM LIMITED
Height: 64 in
Weight: 1992.96 oz

## 2019-01-31 LAB — CBC
HCT: 34 % — ABNORMAL LOW (ref 39.0–52.0)
Hemoglobin: 11.6 g/dL — ABNORMAL LOW (ref 13.0–17.0)
MCH: 31.1 pg (ref 26.0–34.0)
MCHC: 34.1 g/dL (ref 30.0–36.0)
MCV: 91.2 fL (ref 80.0–100.0)
Platelets: 173 10*3/uL (ref 150–400)
RBC: 3.73 MIL/uL — ABNORMAL LOW (ref 4.22–5.81)
RDW: 12.4 % (ref 11.5–15.5)
WBC: 11.6 10*3/uL — ABNORMAL HIGH (ref 4.0–10.5)
nRBC: 0 % (ref 0.0–0.2)

## 2019-01-31 LAB — MAGNESIUM: Magnesium: 1.8 mg/dL (ref 1.7–2.4)

## 2019-01-31 MED ORDER — CLOPIDOGREL BISULFATE 75 MG PO TABS: 75.0000 mg | ORAL_TABLET | Freq: Every day | ORAL | 1 refills | Status: DC

## 2019-01-31 MED FILL — CLOPIDOGREL 75 MG TABLET: 75 | 90 days supply | Qty: 90 | Fill #0 | Status: TO

## 2019-01-31 NOTE — Progress Notes (Signed)
Progress Note  Patient Name: Michael Edwards Date of Encounter: 01/31/2019  Primary Cardiologist: No primary care provider on file. [place]  Subjective   No events overnight. No chest pain or dyspnea.   Inpatient Medications    Scheduled Meds: . aspirin EC  81 mg Oral Daily  . clopidogrel  75 mg Oral Q breakfast  . sodium chloride flush  3 mL Intravenous Q12H  . tamsulosin  0.4 mg Oral QPC breakfast   Continuous Infusions: . sodium chloride    . cefUROXime (ZINACEF)  IV Stopped (01/31/19 0157)  . nitroGLYCERIN     PRN Meds: sodium chloride, acetaminophen **OR** acetaminophen, metoprolol tartrate, morphine injection, ondansetron (ZOFRAN) IV, oxyCODONE, sodium chloride flush, traMADol   Vital Signs    Vitals:   01/31/19 0125 01/31/19 0500 01/31/19 0536 01/31/19 0717  BP: (!) 95/52  (!) 93/50 (!) 102/52  Pulse:    67  Resp: (!) 22  20 16   Temp: 100 F (37.8 C)  98.7 F (37.1 C) 98 F (36.7 C)  TempSrc: Oral  Oral Oral  SpO2:   96% 94%  Weight:  56.5 kg    Height:        Intake/Output Summary (Last 24 hours) at 01/31/2019 0738 Last data filed at 01/31/2019 0600 Gross per 24 hour  Intake 3011.67 ml  Output 25 ml  Net 2986.67 ml   Last 3 Weights 01/31/2019 01/30/2019 01/29/2019  Weight (lbs) 124 lb 9 oz 124 lb 124 lb  Weight (kg) 56.5 kg 56.246 kg 56.246 kg      Telemetry    Sinus- Personally Reviewed  ECG    Sinus brady, non-specific T wave abn - Personally Reviewed  Physical Exam   GEN: No acute distress.   Neck: No JVD Cardiac: RRR, no murmurs, rubs, or gallops.  Respiratory: Clear to auscultation bilaterally. GI: Soft, nontender, non-distended  Ext: No LE edema. Bilateral groins without hematoma or bleeding Neuro:  Nonfocal  Psych: Normal affect   Labs    Chemistry Recent Labs  Lab 01/29/19 1033 01/30/19 0802 01/30/19 0842 01/30/19 0943 01/30/19 0948 01/31/19 0303  NA 136 142 141 141  --  136  K 4.0 4.0 4.0 4.2  --  3.5  CL 107  --   --    --   --  108  CO2 22  --   --   --   --  22  GLUCOSE 112* 112* 101*  --   --  120*  BUN 15  --   --   --   --  10  CREATININE 1.05  --   --   --  0.90 1.05  CALCIUM 8.9  --   --   --   --  8.0*  PROT 6.6  --   --   --   --   --   ALBUMIN 3.7  --   --   --   --   --   AST 19  --   --   --   --   --   ALT 13  --   --   --   --   --   ALKPHOS 81  --   --   --   --   --   BILITOT 0.7  --   --   --   --   --   GFRNONAA >60  --   --   --   --  >60  GFRAA >  60  --   --   --   --  >60  ANIONGAP 7  --   --   --   --  6     Hematology Recent Labs  Lab 01/29/19 1033  01/30/19 0842 01/30/19 0943 01/31/19 0303  WBC 11.4*  --   --   --  11.6*  RBC 4.56  --   --   --  3.73*  HGB 14.1   < > 11.2* 11.6* 11.6*  HCT 42.5   < > 33.0* 34.0* 34.0*  MCV 93.2  --   --   --  91.2  MCH 30.9  --   --   --  31.1  MCHC 33.2  --   --   --  34.1  RDW 12.5  --   --   --  12.4  PLT 243  --   --   --  173   < > = values in this interval not displayed.    Cardiac EnzymesNo results for input(s): TROPONINI in the last 168 hours. No results for input(s): TROPIPOC in the last 168 hours.   BNP Recent Labs  Lab 01/29/19 1033  BNP 125.6*     DDimer No results for input(s): DDIMER in the last 168 hours.   Radiology    Dg Chest 2 View  Result Date: 01/29/2019 CLINICAL DATA:  Preop aortic valve repair EXAM: CHEST - 2 VIEW COMPARISON:  01/09/2019 FINDINGS: The heart size and mediastinal contours are within normal limits. Both lungs are clear. The visualized skeletal structures are unremarkable. IMPRESSION: No active cardiopulmonary disease. Electronically Signed   By: Kathreen Devoid   On: 01/29/2019 11:01   Dg Chest Port 1 View  Result Date: 01/30/2019 CLINICAL DATA:  Status post TAVR. EXAM: PORTABLE CHEST 1 VIEW COMPARISON:  Chest x-ray from yesterday. FINDINGS: Interval TAVR. The heart size and mediastinal contours are within normal limits. Normal pulmonary vascularity. No focal consolidation, pleural  effusion, or pneumothorax. No acute osseous abnormality. IMPRESSION: 1. Interval TAVR.  No active disease. Electronically Signed   By: Titus Dubin M.D.   On: 01/30/2019 11:28    Cardiac Studies     Patient Profile     73 y.o. male with history of severe aortic stenosis, mild CAD and tobacco abuse who was admitted 01/30/19 following TAVR for severe AS.   Assessment & Plan    1. Severe aortic valve stenosis: He is now one day post TAVR from the right transfemoral approach. He is feeling well. Both groins are stable. BP is soft but stable. Ambulating with no dizziness. Echo later today. If echo is ok, will d/c home on ASA and Plavix today. Will plan e-visit follow up in one week with K. Grandville Silos, PA-C.    For questions or updates, please contact [facility] Please consult [email or web address] for contact info under        Signed, Lauree Chandler, MD  01/31/2019, 7:38 AM

## 2019-01-31 NOTE — Progress Notes (Signed)
  Echocardiogram 2D Echocardiogram has been performed.  Michael Edwards 01/31/2019, 9:33 AM

## 2019-01-31 NOTE — Progress Notes (Signed)
CARDIAC REHAB PHASE I   PRE:  Rate/Rhythm: 56 SB   BP:  Sitting: 113/51      SaO2: 96% RA  MODE:  Ambulation: 470 ft   POST:  Rate/Rhythm: 106/51  BP:  Sitting: 106/51      SaO2: 100% RA  Pt ambulated independently. Gait was very steady. Pt denied any complaints of SOB, CP or dizziness. Pt very eager to go h ome. Education started with pt. Reviewed continued IS use, exercise guidelines, risk factors, restrictions, heart healthy nutrition, smoking cessation, phase II Cardiac Rehab. Pt a little hesitant about rehab, but willing to look over brochure and give more thought. Will send referral to [facility] per pt's request.   [id number]  Carma Lair MS, ACSM CEP  10:34 AM 01/31/2019

## 2019-01-31 NOTE — Progress Notes (Signed)
PT educated and provided discharge instructions. Pt iv removed and intact. CCMD notified/telebox removed. Vitals stable. Pt has no complaints. [facility] will meet pt in parking lot to give meds. Volunteers called to tx pt via wheelchair to [facility].  Jerald Kief, RN
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
## 2019-02-01 ENCOUNTER — Telehealth: Payer: Self-pay

## 2019-02-01 NOTE — Telephone Encounter (Signed)
Patient contacted regarding discharge from [facility] on 01/31/2019.  Patient understands to follow up with provider Angelena Form PA-C on 02/07/19 for Virtual Visit.  The pt does not have a computer at home but he does have a smart phone.  His phone does have a camera which he is unsure of how to use but he felt like he could try to Video call for his follow-up.  Patient understands discharge instructions? yes Patient understands medications and regiment? Yes, the pt picked up clopidogrel and started today Patient understands to bring all medications to this visit? Pt is aware that medication will be reviewed during time of virtual visit.   The pt is doing well with only complaint of soreness in groin. The pt denies any pain, swelling or bleeding at sites.  The pt was advised to call [phone] with any additional questions or concerns.

## 2019-02-02 ENCOUNTER — Other Ambulatory Visit: Payer: Self-pay

## 2019-02-02 DIAGNOSIS — Z952 Presence of prosthetic heart valve: Secondary | ICD-10-CM

## 2019-02-05 ENCOUNTER — Telehealth: Payer: Self-pay | Admitting: Physician Assistant

## 2019-02-05 ENCOUNTER — Other Ambulatory Visit: Payer: Self-pay

## 2019-02-05 NOTE — Telephone Encounter (Signed)
Is patient using Smartphone/computer/tablet? Patient has smartphone  Did audio/video work? We got the video to work, but only had audio through phone  Does patient need telephone visit? no  Best phone number to use? [phone]  Special Instructions? Explained to wife that someone would call her Wednesday with room number prior to the appointment.

## 2019-02-07 ENCOUNTER — Other Ambulatory Visit: Payer: Self-pay

## 2019-02-07 ENCOUNTER — Telehealth (INDEPENDENT_AMBULATORY_CARE_PROVIDER_SITE_OTHER): Payer: Medicare Other | Admitting: Physician Assistant

## 2019-02-07 VITALS — Wt 123.0 lb

## 2019-02-07 DIAGNOSIS — Z952 Presence of prosthetic heart valve: Secondary | ICD-10-CM

## 2019-02-07 NOTE — Progress Notes (Signed)
Thanks Katie.

## 2019-02-07 NOTE — Patient Instructions (Addendum)
Hello Michael Edwards,   It was so nice to talk to you on the phone today. I am so glad you are doing so well. I just wanted to send you a recap of our discussion. Please continue on aspirin and plavix.   I will see you next month for an Evisit. 1 month echo has been pushed out given Covid 19.   All appointment details are attached to this letter in your "after visit summary."  Please call us with any questions or concerns you may have and please stay safe during these uncertain times.  Nell Range
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
## 2019-03-14 ENCOUNTER — Other Ambulatory Visit (HOSPITAL_COMMUNITY): Payer: Medicare Other

## 2019-03-14 ENCOUNTER — Telehealth (INDEPENDENT_AMBULATORY_CARE_PROVIDER_SITE_OTHER): Payer: Medicare Other | Admitting: Physician Assistant

## 2019-03-14 ENCOUNTER — Other Ambulatory Visit: Payer: Self-pay | Admitting: Physician Assistant

## 2019-03-14 ENCOUNTER — Other Ambulatory Visit: Payer: Self-pay

## 2019-03-14 VITALS — Ht 65.0 in | Wt 131.2 lb

## 2019-03-14 DIAGNOSIS — R55 Syncope and collapse: Secondary | ICD-10-CM

## 2019-03-14 DIAGNOSIS — Z720 Tobacco use: Secondary | ICD-10-CM

## 2019-03-14 DIAGNOSIS — Z952 Presence of prosthetic heart valve: Secondary | ICD-10-CM

## 2019-03-14 DIAGNOSIS — R911 Solitary pulmonary nodule: Secondary | ICD-10-CM

## 2019-03-14 DIAGNOSIS — R918 Other nonspecific abnormal finding of lung field: Secondary | ICD-10-CM

## 2019-03-14 DIAGNOSIS — Z7189 Other specified counseling: Secondary | ICD-10-CM

## 2019-03-14 NOTE — Patient Instructions (Addendum)
Hello Michael Edwards,   It was so nice to talk to you on the phone today. I am so glad you are doing so well. I just wanted to send you a recap of our discussion.   You can discontinue plavix after 6 months of therapy (around 9/31/2020) or when your pills run out (you were given a 90 day supply with one refill). Please continue on aspirin 81 mg indefinitely.   Your 1 month echo has been rescheduled to 5/18 @ 1:45pm (please arrive 30 minutes early).    You will be seen back by your primary cardiologist, Dr. Debara Pickett, in 3-4 months.   I will see you back in 1 years time with an echo.   All appointment details are attached to this letter in your "after visit summary."  Please call us with any questions or concerns you may have and please stay safe during these uncertain times.  Nell Range

## 2019-03-16 ENCOUNTER — Telehealth (HOSPITAL_COMMUNITY): Payer: Self-pay | Admitting: Radiology

## 2019-03-16 NOTE — Telephone Encounter (Signed)

## 2019-03-19 ENCOUNTER — Other Ambulatory Visit: Payer: Self-pay

## 2019-03-19 ENCOUNTER — Ambulatory Visit (HOSPITAL_COMMUNITY): Payer: Medicare Other | Attending: Cardiology

## 2019-03-19 DIAGNOSIS — Z952 Presence of prosthetic heart valve: Secondary | ICD-10-CM

## 2019-04-06 ENCOUNTER — Other Ambulatory Visit: Payer: Self-pay | Admitting: Physician Assistant

## 2019-04-06 MED ORDER — CLOPIDOGREL BISULFATE 75 MG PO TABS: 75.0000 mg | ORAL_TABLET | Freq: Every day | ORAL | 3 refills | Status: DC

## 2019-04-06 NOTE — Telephone Encounter (Signed)
Pt's medication was sent to pt's pharmacy as requested. Confirmation received.  °

## 2019-04-25 ENCOUNTER — Encounter: Payer: Self-pay | Admitting: Thoracic Surgery (Cardiothoracic Vascular Surgery)

## 2019-04-30 ENCOUNTER — Encounter: Payer: Self-pay | Admitting: Thoracic Surgery (Cardiothoracic Vascular Surgery)

## 2019-06-06 ENCOUNTER — Other Ambulatory Visit (HOSPITAL_COMMUNITY): Payer: Medicare Other

## 2019-06-18 ENCOUNTER — Other Ambulatory Visit (HOSPITAL_COMMUNITY): Payer: Medicare Other

## 2019-06-18 ENCOUNTER — Ambulatory Visit: Payer: Medicare Other | Admitting: Internal Medicine
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
## 2020-01-04 ENCOUNTER — Ambulatory Visit: Payer: Medicare Other | Admitting: Urology

## 2020-01-15 ENCOUNTER — Encounter: Payer: Self-pay | Admitting: Urology

## 2020-01-15 ENCOUNTER — Ambulatory Visit (INDEPENDENT_AMBULATORY_CARE_PROVIDER_SITE_OTHER): Payer: Medicare Other | Admitting: Urology

## 2020-01-15 ENCOUNTER — Other Ambulatory Visit: Payer: Self-pay

## 2020-01-15 VITALS — BP 122/69 | HR 79 | Ht 65.0 in | Wt 129.0 lb

## 2020-01-15 DIAGNOSIS — R339 Retention of urine, unspecified: Secondary | ICD-10-CM | POA: Diagnosis not present

## 2020-01-15 DIAGNOSIS — N529 Male erectile dysfunction, unspecified: Secondary | ICD-10-CM

## 2020-01-15 DIAGNOSIS — N401 Enlarged prostate with lower urinary tract symptoms: Secondary | ICD-10-CM | POA: Diagnosis not present

## 2020-01-15 DIAGNOSIS — R972 Elevated prostate specific antigen [PSA]: Secondary | ICD-10-CM

## 2020-01-15 DIAGNOSIS — N138 Other obstructive and reflux uropathy: Secondary | ICD-10-CM | POA: Diagnosis not present

## 2020-01-15 LAB — BLADDER SCAN AMB NON-IMAGING: Scan Result: 69

## 2020-01-15 MED ORDER — TADALAFIL 20 MG PO TABS: 20.0000 mg | ORAL_TABLET | Freq: Every day | ORAL | 3 refills | Status: DC | PRN

## 2020-01-15 NOTE — Progress Notes (Signed)
01/15/2020 1:53 PM   Michael Edwards [name] 12/22/1945 [id number]  Referring provider: McLean-Scocuzza, Nino Glow, MD [place],  [place] [address]  Chief Complaint  Patient presents with  . Benign Prostatic Hypertrophy    HPI: Mr. Rens is a 74 year old male with an elevated PSA, BPH with LU TS, incomplete bladder emptying and ED who presents today for a yearly follow up.  Elevated PSA 10.9 07/2015 bx negative Component     Latest Ref Rng & Units 08/20/2015 01/04/2019  Prostate Specific Ag, Serum     0.0 - 4.0 ng/mL 8.4 (H) 9.1 (H)    BPH WITH LUTS  (prostate and/or bladder) IPSS score: 12/3   PVR: 69 mL     Major complaint(s):  Frequency.  Denies any dysuria, hematuria or suprapubic pain.   Currently taking: tamsulosin 0.4 mg daily.  CIC in the am, afternoon and bedtime   Denies any recent fevers, chills, nausea or vomiting.  IPSS    Row Name 01/15/20 1300         International Prostate Symptom Score   How often have you had the sensation of not emptying your bladder?  About half the time     How often have you had to urinate less than every two hours?  Less than half the time     How often have you found you stopped and started again several times when you urinated?  Less than 1 in 5 times     How often have you found it difficult to postpone urination?  Less than 1 in 5 times     How often have you had a weak urinary stream?  Less than half the time     How often have you had to strain to start urination?  Less than half the time     How many times did you typically get up at night to urinate?  1 Time     Total IPSS Score  12       Quality of Life due to urinary symptoms   If you were to spend the rest of your life with your urinary condition just the way it is now how would you feel about that?  Mixed        Score:  1-7 Mild 8-19 Moderate 20-35 Severe  Erectile dysfunction SHIM score: 11     Risk factors:  age, BPH, HTN, CAD and smoking No  painful erections or curvatures with his erections.    No longer having spontaneous erections Tried:  Cialis 20 mg    SHIM    Row Name 01/15/20 1331         SHIM: Over the last 6 months:   How do you rate your confidence that you could get and keep an erection?  Low     When you had erections with sexual stimulation, how often were your erections hard enough for penetration (entering your partner)?  A Few Times (much less than half the time)     During sexual intercourse, how often were you able to maintain your erection after you had penetrated (entered) your partner?  A Few Times (much less than half the time)     During sexual intercourse, how difficult was it to maintain your erection to completion of intercourse?  Very Difficult     When you attempted sexual intercourse, how often was it satisfactory for you?  Sometimes (about half the time)  SHIM Total Score   SHIM  11        Score: 1-7 Severe ED 8-11 Moderate ED 12-16 Mild-Moderate ED 17-21 Mild ED 22-25 No ED     PMH: Past Medical History:  Diagnosis Date  . ED (erectile dysfunction)   . Elevated PSA    10.6 on 07/2015  . Enlarged prostate   . Incomplete bladder emptying 07/27/2016  . Myocardial infarction ([place])   . S/P TAVR (transcatheter aortic valve replacement) 01/30/2019   23 mm Edwards Sapien 3 transcatheter heart valve placed via percutaneous right transfemoral approach   . Self-catheterizes urinary bladder   . Severe aortic stenosis 08/19/2015  . Tobacco abuse     Surgical History: Past Surgical History:  Procedure Laterality Date  . ANKLE FRACTURE SURGERY Left   . blood clot removed from left upper leg    . INGUINAL HERNIA REPAIR Left 02/03/2017   Procedure: HERNIA REPAIR INGUINAL ADULT;  Surgeon: Clayburn Pert, MD;  Location: [facility];  Service: General;  Laterality: Left;  . RIGHT HEART CATH AND CORONARY ANGIOGRAPHY N/A 01/12/2019   Procedure: RIGHT HEART CATH AND CORONARY ANGIOGRAPHY;   Surgeon: Wellington Hampshire, MD;  Location: [place] [facility];  Service: Cardiovascular;  Laterality: N/A;  . TEE WITHOUT CARDIOVERSION N/A 01/30/2019   Procedure: TRANSESOPHAGEAL ECHOCARDIOGRAM (TEE);  Surgeon: Burnell Blanks, MD;  Location: [place] [facility];  Service: Open Heart Surgery;  Laterality: N/A;  . TONSILLECTOMY    . TRANSCATHETER AORTIC VALVE REPLACEMENT, TRANSFEMORAL N/A 01/30/2019   Procedure: TRANSCATHETER AORTIC VALVE REPLACEMENT, TRANSFEMORAL;  Surgeon: Burnell Blanks, MD;  Location: [place] [facility];  Service: Open Heart Surgery;  Laterality: N/A;  . VASECTOMY      Home Medications:  Allergies as of 01/15/2020   No Known Allergies     Medication List       Accurate as of January 15, 2020  1:53 PM. If you have any questions, ask your nurse or doctor.        STOP taking these medications   clopidogrel 75 MG tablet Commonly known as: PLAVIX Stopped by: Zara Council, PA-C     TAKE these medications   aspirin EC 81 MG tablet Take 81 mg by mouth daily.   multivitamin tablet Take 1 tablet by mouth daily.   tadalafil 20 MG tablet Commonly known as: CIALIS Take 1 tablet (20 mg total) by mouth daily as needed for erectile dysfunction.   tamsulosin 0.4 MG Caps capsule Commonly known as: FLOMAX Take 1 capsule (0.4 mg total) by mouth daily after breakfast.       Allergies: No Known Allergies  Family History: Family History  Problem Relation Age of Onset  . Hematuria Mother   . Peripheral Artery Disease Father   . Diabetes Sister   . Heart failure Sister     Social History:  reports that he has been smoking. He has a 58.00 pack-year smoking history. He has never used smokeless tobacco. He reports current alcohol use. He reports that he does not use drugs.  ROS: Pertinent ROS in HPI  Physical Exam: BP 122/69   Pulse 79   Ht 5\' 5"  (1.651 m)   Wt 129 lb (58.5 kg)   BMI 21.47 kg/m   Constitutional:  Well nourished. Alert and  oriented, No acute distress. HEENT: [place] AT, mask in place.  Trachea midline, no masses. Cardiovascular: No clubbing, cyanosis, or edema. Respiratory: Normal respiratory effort, no increased work of breathing. GI: Abdomen  is soft, non tender, non distended, no abdominal masses.   GU: No CVA tenderness.  No bladder fullness or masses.  Patient with circumcised phallus.  Urethral meatus is patent.  No penile discharge. No penile lesions or rashes. Scrotum without lesions, cysts, rashes and/or edema.  Testicles are located scrotally bilaterally. No masses are appreciated in the testicles. Left and right epididymis are normal. Rectal: Patient with  normal sphincter tone. Anus and perineum without scarring or rashes. No rectal masses are appreciated. Prostate is approximately 50 grams, no nodules are appreciated. Seminal vesicles could not be palpated.  Skin: No rashes, bruises or suspicious lesions. Lymph: No inguinal adenopathy. Neurologic: Grossly intact, no focal deficits, moving all 4 extremities. Psychiatric: Normal mood and affect.  Laboratory Data: Lab Results  Component Value Date   WBC 11.6 (H) 01/31/2019   HGB 11.6 (L) 01/31/2019   HCT 34.0 (L) 01/31/2019   MCV 91.2 01/31/2019   PLT 173 01/31/2019    Lab Results  Component Value Date   CREATININE 1.05 01/31/2019    Lab Results  Component Value Date   PSA 10.6 07/14/2015    Lab Results  Component Value Date   HGBA1C 5.5 01/29/2019    Lab Results  Component Value Date   TSH 1.357 01/10/2019       Component Value Date/Time   CHOL 156 01/12/2019 0412   HDL 65 01/12/2019 0412   CHOLHDL 2.4 01/12/2019 0412   VLDL 12 01/12/2019 0412   LDLCALC 79 01/12/2019 0412    Lab Results  Component Value Date   AST 19 01/29/2019   Lab Results  Component Value Date   ALT 13 01/29/2019    Urinalysis    Component Value Date/Time   COLORURINE YELLOW 01/29/2019 1032   APPEARANCEUR CLEAR 01/29/2019 1032   APPEARANCEUR  Cloudy (A) 07/27/2017 1020   LABSPEC 1.005 01/29/2019 1032   PHURINE 5.0 01/29/2019 1032   GLUCOSEU NEGATIVE 01/29/2019 1032   HGBUR NEGATIVE 01/29/2019 1032   BILIRUBINUR NEGATIVE 01/29/2019 1032   BILIRUBINUR Negative 07/27/2017 [place] 01/29/2019 1032   PROTEINUR NEGATIVE 01/29/2019 1032   NITRITE NEGATIVE 01/29/2019 1032   LEUKOCYTESUR NEGATIVE 01/29/2019 1032    I have reviewed the labs.   Pertinent Imaging: Results for TIELER, MARKHAM (MRN [id number]) as of 01/15/2020 13:42  Ref. Range 01/15/2020 13:35  Scan Result Unknown 69     Assessment & Plan:    1. Benign prostatic hyperplasia with urinary obstruction - PSA - Bladder Scan (Post Void Residual) in office IPSS score is 12/3 Continue conservative management, avoiding bladder irritants and timed voiding's Most bothersome symptoms is/are tamsulosin 0.4 mg daily  Continue tamsulosin 0.4 mg daily - refills given RTC in 12 months for IPSS, PSA, PVR and exam   2. Incomplete emptying  CIC x 3 times daily Voiding on his own as well  3. Erectile dysfunction - SHIM score is 11  - Continue Cialis, prescription sent to [place] in 12 months for repeat SHIM score and exam   4. Elevated PSA - PSA has been stable - biopsy negative in 2016   Return in about 1 year (around 01/14/2021) for IPSS, SHIM, PSA, PVR and exam.  These notes generated with voice recognition software. I apologize for typographical errors.  Zara Council, PA-C  [facility] [address]  [place], [place] [address] [phone]

## 2020-01-16 ENCOUNTER — Telehealth: Payer: Self-pay | Admitting: Family Medicine

## 2020-01-16 LAB — PSA: Prostate Specific Ag, Serum: 9.5 ng/mL — ABNORMAL HIGH (ref 0.0–4.0)

## 2020-01-16 NOTE — Telephone Encounter (Signed)
LMOM notified of results. °

## 2020-01-16 NOTE — Telephone Encounter (Signed)
-----   Message from Nori Riis, PA-C sent at 01/16/2020  9:49 AM EDT ----- Please let Mr. Karmel know that his PSA is stable at 9.5.  We will see him next year.

## 2020-02-11 NOTE — Progress Notes (Signed)
This encounter was created in error - please disregard.

## 2020-02-13 ENCOUNTER — Encounter: Payer: Medicare Other | Admitting: Physician Assistant

## 2020-02-13 ENCOUNTER — Other Ambulatory Visit (HOSPITAL_COMMUNITY): Payer: Medicare Other

## 2020-02-13 ENCOUNTER — Other Ambulatory Visit: Payer: Medicare Other

## 2020-02-14 ENCOUNTER — Other Ambulatory Visit: Payer: Medicare Other

## 2020-02-14 ENCOUNTER — Telehealth: Payer: Self-pay | Admitting: Physician Assistant

## 2020-02-14 NOTE — Telephone Encounter (Signed)
  [place] VALVE TEAM  Patient did not show up for 1 year follow up apt and echo s/p TAVR. He also had a CT chest ordered to follow a pulmonary nodule given ongoing tobacco abuse. He has had financial trouble with the pandemic and cannot afford any medical tests right now. I did call him to check in and he is doing fantastic with NYHA class I symptoms. KCCQ completed over the phone.   [facility] Cardiomyopathy Questionnaire  KCCQ-12 02/14/2020  1 a. Ability to shower/bathe Not at all limited  1 b. Ability to walk 1 block Not at all limited  1 c. Ability to hurry/jog Other, Did not do  2. Edema feet/ankles/legs Never over the past 2 weeks  3. Limited by fatigue Never over the past 2 weeks  4. Limited by dyspnea Never over the past 2 weeks  5. Sitting up / on 3+ pillows Never over the past 2 weeks  6. Limited enjoyment of life Not limited at all  7. Rest of life w/ symptoms Completely satisfied  8 a. Participation in hobbies Did not limit at all  8 b. Participation in chores Did not limit at all  8 c. Visiting family/friends Did not limit at all     He will call back when he feels ready to have follow up arranged.   Angelena Form PA-C  [facility]

## 2020-03-15 ENCOUNTER — Other Ambulatory Visit: Payer: Self-pay | Admitting: Urology

## 2020-04-16 DIAGNOSIS — Z23 Encounter for immunization: Secondary | ICD-10-CM | POA: Diagnosis not present

## 2020-04-17 DIAGNOSIS — L570 Actinic keratosis: Secondary | ICD-10-CM | POA: Diagnosis not present

## 2020-04-17 DIAGNOSIS — S66329A Laceration of extensor muscle, fascia and tendon of unspecified finger at wrist and hand level, initial encounter: Secondary | ICD-10-CM | POA: Diagnosis not present

## 2020-04-17 DIAGNOSIS — N400 Enlarged prostate without lower urinary tract symptoms: Secondary | ICD-10-CM | POA: Diagnosis not present

## 2020-04-17 DIAGNOSIS — E785 Hyperlipidemia, unspecified: Secondary | ICD-10-CM | POA: Diagnosis not present

## 2020-04-17 DIAGNOSIS — F17209 Nicotine dependence, unspecified, with unspecified nicotine-induced disorders: Secondary | ICD-10-CM | POA: Diagnosis not present

## 2020-04-24 DIAGNOSIS — N400 Enlarged prostate without lower urinary tract symptoms: Secondary | ICD-10-CM | POA: Diagnosis not present

## 2020-04-24 DIAGNOSIS — L570 Actinic keratosis: Secondary | ICD-10-CM | POA: Diagnosis not present

## 2020-04-24 DIAGNOSIS — J449 Chronic obstructive pulmonary disease, unspecified: Secondary | ICD-10-CM | POA: Diagnosis not present

## 2020-05-07 ENCOUNTER — Other Ambulatory Visit: Payer: Self-pay | Admitting: Urology

## 2020-05-07 DIAGNOSIS — Z23 Encounter for immunization: Secondary | ICD-10-CM | POA: Diagnosis not present

## 2020-06-18 ENCOUNTER — Other Ambulatory Visit: Payer: Self-pay | Admitting: Urology

## 2020-07-18 ENCOUNTER — Other Ambulatory Visit: Payer: Self-pay | Admitting: Urology

## 2020-08-04 ENCOUNTER — Other Ambulatory Visit: Payer: Self-pay

## 2020-08-04 ENCOUNTER — Ambulatory Visit (INDEPENDENT_AMBULATORY_CARE_PROVIDER_SITE_OTHER): Payer: Medicare Other | Admitting: Dermatology

## 2020-08-04 DIAGNOSIS — D485 Neoplasm of uncertain behavior of skin: Secondary | ICD-10-CM

## 2020-08-04 DIAGNOSIS — L578 Other skin changes due to chronic exposure to nonionizing radiation: Secondary | ICD-10-CM

## 2020-08-04 DIAGNOSIS — D0461 Carcinoma in situ of skin of right upper limb, including shoulder: Secondary | ICD-10-CM | POA: Diagnosis not present

## 2020-08-04 DIAGNOSIS — C4492 Squamous cell carcinoma of skin, unspecified: Secondary | ICD-10-CM

## 2020-08-04 HISTORY — DX: Squamous cell carcinoma of skin, unspecified: C44.92

## 2020-08-04 NOTE — Patient Instructions (Signed)

## 2020-08-04 NOTE — Progress Notes (Signed)
   New Patient Visit  Subjective  Michael Edwards is a 74 y.o. male who presents for the following: Other (Spot of right hand betweenthumb and first finger x ~1 year. Hand sore.).  The following portions of the chart were reviewed this encounter and updated as appropriate:  Tobacco  Allergies  Meds  Problems  Med Hx  Surg Hx  Fam Hx     Review of Systems:  No other skin or systemic complaints except as noted in HPI or Assessment and Plan.  Objective  Well appearing patient in no apparent distress; mood and affect are within normal limits.  A focused examination was performed including right hand. Relevant physical exam findings are noted in the Assessment and Plan.  Objective  Right thumb webspace: Crusted ulceration 2.2 cm        Assessment & Plan  Neoplasm of uncertain behavior of skin -rule out squamous cell carcinoma versus viral wart Right thumb webspace  Skin / nail biopsy Type of biopsy: tangential   Informed consent: discussed and consent obtained   Timeout: patient name, date of birth, surgical site, and procedure verified   Procedure prep:  Patient was prepped and draped in usual sterile fashion Prep type:  Isopropyl alcohol Anesthesia: the lesion was anesthetized in a standard fashion   Anesthetic:  1% lidocaine w/ epinephrine 1-100,000 buffered w/ 8.4% NaHCO3 Instrument used: flexible razor blade   Hemostasis achieved with: pressure, aluminum chloride and electrodesiccation   Outcome: patient tolerated procedure well   Post-procedure details: sterile dressing applied and wound care instructions given   Dressing type: bandage and petrolatum    Specimen 1 - Surgical pathology Differential Diagnosis: SCC vs other  Check Margins: No Crusted ulceration 2.2 cm  Actinic Damage -severe - diffuse scaly erythematous macules with underlying dyspigmentation - Recommend daily broad spectrum sunscreen SPF 30+ to sun-exposed areas, reapply every 2 hours as  needed.  - Call for new or changing lesions.  Return if symptoms worsen or fail to improve.   I, Ashok Cordia, CMA, am acting as scribe for Sarina Ser, MD .  Documentation: I have reviewed the above documentation for accuracy and completeness, and I agree with the above.  Sarina Ser, MD

## 2020-08-05 ENCOUNTER — Encounter: Payer: Self-pay | Admitting: Dermatology

## 2020-08-11 ENCOUNTER — Telehealth: Payer: Self-pay

## 2020-08-11 NOTE — Telephone Encounter (Signed)
Patient informed of pathology results and appointment scheduled.  °

## 2020-08-11 NOTE — Telephone Encounter (Signed)
-----   Message from Ralene Bathe, MD sent at 08/07/2020  6:19 PM EDT ----- Skin , right thumb webspace SQUAMOUS CELL CARCINOMA IN SITU, HYPERTROPHIC, ULCERATED, BASE INVOLVED  Cancer - SCC in situ Superficial Schedule for treatment (shave removal and EDC)

## 2020-08-17 ENCOUNTER — Other Ambulatory Visit: Payer: Self-pay | Admitting: Urology

## 2020-08-20 ENCOUNTER — Telehealth: Payer: Self-pay | Admitting: Urology

## 2020-08-20 MED ORDER — TADALAFIL 20 MG PO TABS: ORAL_TABLET | ORAL | 1 refills | Status: DC

## 2020-08-20 NOTE — Telephone Encounter (Signed)
Patient called the office today requesting a refill of Cialis to be sent to the [facility] on [address].  Please call patient if unable to refill.

## 2020-10-20 ENCOUNTER — Other Ambulatory Visit: Payer: Self-pay | Admitting: *Deleted

## 2020-10-21 ENCOUNTER — Ambulatory Visit (INDEPENDENT_AMBULATORY_CARE_PROVIDER_SITE_OTHER): Payer: Medicare Other | Admitting: Dermatology

## 2020-10-21 ENCOUNTER — Encounter: Payer: Self-pay | Admitting: Dermatology

## 2020-10-21 ENCOUNTER — Other Ambulatory Visit: Payer: Self-pay

## 2020-10-21 DIAGNOSIS — L578 Other skin changes due to chronic exposure to nonionizing radiation: Secondary | ICD-10-CM | POA: Diagnosis not present

## 2020-10-21 DIAGNOSIS — D099 Carcinoma in situ, unspecified: Secondary | ICD-10-CM

## 2020-10-21 DIAGNOSIS — C44622 Squamous cell carcinoma of skin of right upper limb, including shoulder: Secondary | ICD-10-CM | POA: Diagnosis not present

## 2020-10-21 DIAGNOSIS — D0461 Carcinoma in situ of skin of right upper limb, including shoulder: Secondary | ICD-10-CM

## 2020-10-21 MED ORDER — MUPIROCIN 2 % EX OINT: 1.0000 "application " | TOPICAL_OINTMENT | Freq: Every day | CUTANEOUS | 1 refills | Status: DC

## 2020-10-21 MED ORDER — TADALAFIL 20 MG PO TABS: ORAL_TABLET | ORAL | 1 refills | Status: DC

## 2020-10-21 NOTE — Patient Instructions (Signed)

## 2020-10-21 NOTE — Progress Notes (Signed)
   Follow-Up Visit   Subjective  Michael Edwards is a 74 y.o. male who presents for the following: squamous cell carcinoma in situ (R thumb webspace - bx proven, patient is here today for shave removal and  ED&C). Pt has other changes of skin to be evaluated.  The following portions of the chart were reviewed this encounter and updated as appropriate:   Tobacco  Allergies  Meds  Problems  Med Hx  Surg Hx  Fam Hx     Review of Systems:  No other skin or systemic complaints except as noted in HPI or Assessment and Plan.  Objective  Well appearing patient in no apparent distress; mood and affect are within normal limits.  A focused examination was performed including R hand. Relevant physical exam findings are noted in the Assessment and Plan.  Objective  R thumb webspace: Healing biopsy site 3.2cm   Assessment & Plan  Squamous cell carcinoma in situ Discussed treatment options.  Because path shows "in situ" discussed simple procedure with simple excision.  Discussed potential for need for additional procedure if recurrence or if path from excision different from biopsy.  Pt wants to proceed with procedure today. R thumb webspace  Skin excision  Lesion length (cm):  3.2 Lesion width (cm):  3.2 Margin per side (cm):  0.1 Total excision diameter (cm):  3.4 Informed consent: discussed and consent obtained   Timeout: patient name, date of birth, surgical site, and procedure verified   Procedure prep:  Patient was prepped and draped in usual sterile fashion Prep type:  Isopropyl alcohol and povidone-iodine Anesthesia: the lesion was anesthetized in a standard fashion   Anesthetic:  1% lidocaine w/ epinephrine 1-100,000 buffered w/ 8.4% NaHCO3 (7cc) Instrument used: #15 blade   Hemostasis achieved with: pressure, aluminum chloride and Gelfoam   Hemostasis achieved with comment:  Electrocautery Outcome: patient tolerated procedure well with no complications   Post-procedure  details: sterile dressing applied and wound care instructions given   Dressing type: bandage, pressure dressing and bacitracin (Mupirocin)    mupirocin ointment (BACTROBAN) 2 %  Specimen 1 - Surgical pathology Differential Diagnosis: D04.61 Bx proven SCC IS Check Margins: yes Healing biopsy site 3.2cm [phone]  Hypertrophic SCC IS bx proven  Simple excision today Discussed risk of recurrence Start Mupirocin oint qd to wound  Actinic Damage - chronic, secondary to cumulative UV radiation exposure/sun exposure over time - diffuse scaly erythematous macules with underlying dyspigmentation - Recommend daily broad spectrum sunscreen SPF 30+ to sun-exposed areas, reapply every 2 hours as needed.  - Call for new or changing lesions.  Return in about 2 weeks (around 11/04/2020) for recheck SCCIS R thumb webspace.   I, Othelia Pulling, RMA, am acting as scribe for Sarina Ser, MD .  Documentation: I have reviewed the above documentation for accuracy and completeness, and I agree with the above.  Sarina Ser, MD

## 2020-10-28 ENCOUNTER — Telehealth: Payer: Self-pay

## 2020-10-28 NOTE — Telephone Encounter (Signed)
-----   Message from Deirdre Evener, MD sent at 10/27/2020  3:31 PM EST ----- Diagnosis Skin (M), right thumb webspace WELL DIFFERENTIATED SQUAMOUS CELL CARCINOMA, LATERAL AND DEEP MARGINS INVOLVED  Cancer - SCC (initial biopsy showed SCC in situ = superficial) May need additional treatment Recheck next visit Pt does not appear to have a follow up appt. Please MAKE APPT FOR PATIENT no later than 3-4 weeks for re-evaluation.   If he does not have Mupirocin for wound care, send Mupirocin to pharmacy for him.

## 2020-10-28 NOTE — Telephone Encounter (Signed)
Patient informed of pathology results and appointment scheduled for recheck.  °

## 2020-10-30 ENCOUNTER — Encounter: Payer: Self-pay | Admitting: Dermatology

## 2020-11-05 ENCOUNTER — Ambulatory Visit (INDEPENDENT_AMBULATORY_CARE_PROVIDER_SITE_OTHER): Payer: Medicare Other | Admitting: Dermatology

## 2020-11-05 ENCOUNTER — Other Ambulatory Visit: Payer: Self-pay

## 2020-11-05 DIAGNOSIS — C4492 Squamous cell carcinoma of skin, unspecified: Secondary | ICD-10-CM

## 2020-11-05 DIAGNOSIS — C44622 Squamous cell carcinoma of skin of right upper limb, including shoulder: Secondary | ICD-10-CM

## 2020-11-05 NOTE — Progress Notes (Signed)
   Follow-Up Visit   Subjective  Michael Edwards is a 75 y.o. male who presents for the following: Squamous Cell Carcinoma (R thumb webspace, f/u from simple excision, margins involved bx proven).  The following portions of the chart were reviewed this encounter and updated as appropriate:   Tobacco  Allergies  Meds  Problems  Med Hx  Surg Hx  Fam Hx     Review of Systems:  No other skin or systemic complaints except as noted in HPI or Assessment and Plan.  Objective  Well appearing patient in no apparent distress; mood and affect are within normal limits.  A focused examination was performed including Right hand. Relevant physical exam findings are noted in the Assessment and Plan.  Objective  Right thumb webspace: Healing simple excision site, no evidence of recurrence No lymphadenopathy   Assessment & Plan  Squamous cell carcinoma of skin Right thumb webspace  Bx proven, WELL DIFFERENTIATED SQUAMOUS CELL CARCINOMA, LATERAL AND DEEP MARGINS INVOLVED  Healing well, no evidence of recurrence.  Discussed if recurrence will need additional procedure.  Cont Mupirocin oint qd with wound care  Return in about 6 weeks (around 12/17/2020) for SCC f/u R thumb/webspace.  I, Ardis Rowan, RMA, am acting as scribe for Armida Sans, MD .  Documentation: I have reviewed the above documentation for accuracy and completeness, and I agree with the above.  Armida Sans, MD

## 2020-11-06 ENCOUNTER — Encounter: Payer: Self-pay | Admitting: Dermatology

## 2020-11-12 DIAGNOSIS — J449 Chronic obstructive pulmonary disease, unspecified: Secondary | ICD-10-CM | POA: Diagnosis not present

## 2020-11-12 DIAGNOSIS — R5383 Other fatigue: Secondary | ICD-10-CM | POA: Diagnosis not present

## 2020-11-12 DIAGNOSIS — R69 Illness, unspecified: Secondary | ICD-10-CM | POA: Diagnosis not present

## 2020-11-12 DIAGNOSIS — E782 Mixed hyperlipidemia: Secondary | ICD-10-CM | POA: Diagnosis not present

## 2020-11-12 DIAGNOSIS — R270 Ataxia, unspecified: Secondary | ICD-10-CM | POA: Diagnosis not present

## 2020-11-12 DIAGNOSIS — R42 Dizziness and giddiness: Secondary | ICD-10-CM | POA: Diagnosis not present

## 2020-11-14 ENCOUNTER — Telehealth: Payer: Self-pay

## 2020-11-14 NOTE — Telephone Encounter (Signed)
Refill request from [facility] for catheters states intermittent cath coude with or with out hydrophilic coating. Contacted patient to ensure that he was receiving the coloplast speedi cath that his original order was placed for. Patient states he is receiving the coloplast catheters and they are working well for him he does not wish to change to a different catheter. He will contact our office if [facility] contacts him in regards to changing his catheter type.

## 2020-11-25 ENCOUNTER — Ambulatory Visit: Payer: Medicare Other | Admitting: Dermatology

## 2020-11-27 DIAGNOSIS — R270 Ataxia, unspecified: Secondary | ICD-10-CM | POA: Diagnosis not present

## 2020-11-27 DIAGNOSIS — J449 Chronic obstructive pulmonary disease, unspecified: Secondary | ICD-10-CM | POA: Diagnosis not present

## 2020-11-27 DIAGNOSIS — J019 Acute sinusitis, unspecified: Secondary | ICD-10-CM | POA: Diagnosis not present

## 2020-11-27 DIAGNOSIS — R42 Dizziness and giddiness: Secondary | ICD-10-CM | POA: Diagnosis not present

## 2020-11-27 DIAGNOSIS — H6982 Other specified disorders of Eustachian tube, left ear: Secondary | ICD-10-CM | POA: Diagnosis not present

## 2020-11-27 DIAGNOSIS — E782 Mixed hyperlipidemia: Secondary | ICD-10-CM | POA: Diagnosis not present

## 2020-12-08 DIAGNOSIS — R6889 Other general symptoms and signs: Secondary | ICD-10-CM | POA: Diagnosis not present

## 2020-12-08 DIAGNOSIS — J208 Acute bronchitis due to other specified organisms: Secondary | ICD-10-CM | POA: Diagnosis not present

## 2020-12-08 DIAGNOSIS — Z03818 Encounter for observation for suspected exposure to other biological agents ruled out: Secondary | ICD-10-CM | POA: Diagnosis not present

## 2020-12-08 DIAGNOSIS — B9689 Other specified bacterial agents as the cause of diseases classified elsewhere: Secondary | ICD-10-CM | POA: Diagnosis not present

## 2020-12-26 ENCOUNTER — Other Ambulatory Visit: Payer: Self-pay | Admitting: Urology

## 2020-12-31 ENCOUNTER — Ambulatory Visit: Payer: Medicare Other | Admitting: Dermatology

## 2021-01-08 ENCOUNTER — Other Ambulatory Visit: Payer: Self-pay

## 2021-01-08 ENCOUNTER — Other Ambulatory Visit: Payer: Medicare Other

## 2021-01-08 DIAGNOSIS — R972 Elevated prostate specific antigen [PSA]: Secondary | ICD-10-CM | POA: Diagnosis not present

## 2021-01-09 LAB — PSA: Prostate Specific Ag, Serum: 9.9 ng/mL — ABNORMAL HIGH (ref 0.0–4.0)

## 2021-01-15 NOTE — Progress Notes (Signed)
01/16/2021 12:26 PM   Michael Edwards 1946-04-15 [id number]  Referring provider: Perrin Maltese, MD [place],  [place] [address]  Chief Complaint  Patient presents with  . Follow-up    1 year follow-up   Urological history: 1. Elevated PSA - PSA Trend Prostate biopsy negative in 2016 iPSA 10.9 Component     Latest Ref Rng & Units 01/04/2019 01/15/2020 01/08/2021  Prostate Specific Ag, Serum     0.0 - 4.0 ng/mL 9.1 (H) 9.5 (H) 9.9 (H)   2. BPH with LU TS - I PSS 14/3 - PVR 185 mL  - cysto 2017 Enlarged prostate with bilateral obstructing lobes, no median bar, prostatic urethra fairly short - managed with tamsulosin 0.4 mg daily and CIC TID  3. ED - contributing factors ofage, BPH, HTN, CAD and smoking - managed with tadalafil 20 mg, on-demand-dosing    HPI: Michael Edwards is 75 y.o. male for a yearly follow up.  He continues to CIC x 3 daily.  He is able to void independently as well.  He has had some instances of gross hematuria during self cath's, but he has not had gross heme with spontaneous voids.    Patient denies any modifying or aggravating factors.  Patient denies any gross hematuria, dysuria or suprapubic/flank pain.  Patient denies any fevers, chills, nausea or vomiting.     IPSS    Row Name 01/16/21 1100         International Prostate Symptom Score   How often have you had the sensation of not emptying your bladder? Less than half the time     How often have you had to urinate less than every two hours? Less than half the time     How often have you found you stopped and started again several times when you urinated? Less than 1 in 5 times     How often have you found it difficult to postpone urination? About half the time     How often have you had a weak urinary stream? Less than half the time     How often have you had to strain to start urination? Less than half the time     How many times did you typically get up at night to  urinate? 2 Times     Total IPSS Score 14           Quality of Life due to urinary symptoms   If you were to spend the rest of your life with your urinary condition just the way it is now how would you feel about that? Mixed            Score:  1-7 Mild 8-19 Moderate 20-35 Severe   PMH: Past Medical History:  Diagnosis Date  . ED (erectile dysfunction)   . Elevated PSA    10.6 on 07/2015  . Enlarged prostate   . Incomplete bladder emptying 07/27/2016  . Myocardial infarction ([place])   . S/P TAVR (transcatheter aortic valve replacement) 01/30/2019   23 mm Edwards Sapien 3 transcatheter heart valve placed via percutaneous right transfemoral approach   . Self-catheterizes urinary bladder   . Severe aortic stenosis 08/19/2015  . Squamous cell carcinoma of skin 08/04/2020   Right thumb. SCCis, hypertrophic. shave removal and EDC 10/21/20  . Tobacco abuse     Surgical History: Past Surgical History:  Procedure Laterality Date  . ANKLE FRACTURE SURGERY Left   . blood clot removed  from left upper leg    . INGUINAL HERNIA REPAIR Left 02/03/2017   Procedure: HERNIA REPAIR INGUINAL ADULT;  Surgeon: Michael Pert, MD;  Location: [facility];  Service: General;  Laterality: Left;  . RIGHT HEART CATH AND CORONARY ANGIOGRAPHY N/A 01/12/2019   Procedure: RIGHT HEART CATH AND CORONARY ANGIOGRAPHY;  Surgeon: Michael Hampshire, MD;  Location: [facility];  Service: Cardiovascular;  Laterality: N/A;  . TEE WITHOUT CARDIOVERSION N/A 01/30/2019   Procedure: TRANSESOPHAGEAL ECHOCARDIOGRAM (TEE);  Surgeon: Michael Blanks, MD;  Location: [place] [facility];  Service: Open Heart Surgery;  Laterality: N/A;  . TONSILLECTOMY    . TRANSCATHETER AORTIC VALVE REPLACEMENT, TRANSFEMORAL N/A 01/30/2019   Procedure: TRANSCATHETER AORTIC VALVE REPLACEMENT, TRANSFEMORAL;  Surgeon: Michael Blanks, MD;  Location: [place];  Service: Open Heart Surgery;  Laterality: N/A;  . VASECTOMY       Home Medications:  Allergies as of 01/16/2021   No Known Allergies     Medication List       Accurate as of January 16, 2021 12:26 PM. If you have any questions, ask your nurse or doctor.        aspirin EC 81 MG tablet Take 81 mg by mouth daily.   multivitamin tablet Take 1 tablet by mouth daily.   mupirocin ointment 2 % Commonly known as: BACTROBAN Apply 1 application topically daily. Qd to wound on right hand   tadalafil 20 MG tablet Commonly known as: CIALIS Take 1 tablet (20 mg total) by mouth daily as needed for erectile dysfunction.   tamsulosin 0.4 MG Caps capsule Commonly known as: FLOMAX TAKE 1 CAPSULE (0.4 MG TOTAL) BY MOUTH DAILY AFTER BREAKFAST.       Allergies: No Known Allergies  Family History: Family History  Problem Relation Age of Onset  . Hematuria Mother   . Peripheral Artery Disease Father   . Diabetes Sister   . Heart failure Sister     Social History:  reports that he has been smoking. He has a 58.00 pack-year smoking history. He has never used smokeless tobacco. He reports current alcohol use. He reports that he does not use drugs.  ROS: Pertinent ROS in HPI  Physical Exam: BP 118/61   Pulse 75   Ht 5\' 5"  (1.651 m)   Wt 126 lb (57.2 kg)   BMI 20.97 kg/m   Constitutional:  Well nourished. Alert and oriented, No acute distress. HEENT: [facility] AT, mask in place.  Trachea midline Cardiovascular: No clubbing, cyanosis, or edema. Respiratory: Normal respiratory effort, no increased work of breathing. GU: No CVA tenderness.  No bladder fullness or masses.  Patient with circumcised phallus. Urethral meatus is patent.  No penile discharge. No penile lesions or rashes. Scrotum without lesions, cysts, rashes and/or edema.  Testicles are located scrotally bilaterally. No masses are appreciated in the testicles. Left and right epididymis are normal. Rectal: Patient with  normal sphincter tone. Anus and perineum without scarring or rashes. No  rectal masses are appreciated. Prostate is approximately 50 + grams, no nodules are appreciated. Seminal vesicles could not be palpated Lymph: No inguinal adenopathy. Neurologic: Grossly intact, no focal deficits, moving all 4 extremities. Psychiatric: Normal mood and affect.  Laboratory Data: No recent labs   Pertinent Imaging: Results for KEIJUAN, SCHELLHASE (MRN [id number]) as of 01/16/2021 13:33  Ref. Range 01/16/2021 11:32  Scan Result Unknown 190ml      Assessment & Plan:    1. Benign prostatic hyperplasia  with urinary obstruction - I PSS score is worse - Continue conservative management, avoiding bladder irritants and timed voiding's - he still is deferring a bladder outlet procedure due to medical expense, says he and his wife are so far behind financially   - Continue tamsulosin 0.4 mg daily   2. Incomplete emptying  - continue CIC x 3 times daily - catheters acquired through insurance and patient assistance, so they are affordable - having spontaneous voids as well  3. Erectile dysfunction - Continue Cialis, prescription sent to [facility]  4. Elevated PSA - PSA has been stable - biopsy negative in 2016 - continue to follow  5. Gross hematuria - I explained to the patient that although the hematuria he is experiencing is likely due to trauma to the prostate during self caths, but there are a number of other causes that can be associated with blood in the urine, such as stones, BPH, UTI's, damage to the urinary tract and/or cancer. - At this time, I felt that the patient warranted further urologic evaluation. The AUA guidelines place him in the high risk category due to age, gross hematuria and smoking status and that a CT urogram is the preferred imaging study to evaluate hematuria. - I explained to the patient that a contrast material will be injected into a vein and that in rare instances, an allergic reaction can result and may even life threatening (1:100,000)  The  patient denies any allergies to contrast, iodine and/or seafood and is not taking metformin. - Following the imaging study,  I've recommended a cystoscopy. I described how this is performed, typically in an office setting with a flexible cystoscope. We described the risks, benefits, and possible side effects, the most common of which is a minor amount of blood in the urine and/or burning which usually resolves in 24 to 48 hours.   - The patient had the opportunity to ask questions which were answered. Based upon this discussion, the patient is willing to proceed. Therefore, I've ordered: a CT Urogram and cystoscopy. - The patient will return following all of the above for discussion of the results.   Return for CT Urogram report and cystoscopy with Dr. Erlene Quan for gross hematuria .  These notes generated with voice recognition software. I apologize for typographical errors.  Zara Council, PA-C  [facility] [address]  [place], [place] [address] [phone]

## 2021-01-16 ENCOUNTER — Encounter: Payer: Self-pay | Admitting: Urology

## 2021-01-16 ENCOUNTER — Other Ambulatory Visit: Payer: Self-pay

## 2021-01-16 ENCOUNTER — Ambulatory Visit (INDEPENDENT_AMBULATORY_CARE_PROVIDER_SITE_OTHER): Payer: Medicare Other | Admitting: Urology

## 2021-01-16 VITALS — BP 118/61 | HR 75 | Ht 65.0 in | Wt 126.0 lb

## 2021-01-16 DIAGNOSIS — N138 Other obstructive and reflux uropathy: Secondary | ICD-10-CM

## 2021-01-16 DIAGNOSIS — N401 Enlarged prostate with lower urinary tract symptoms: Secondary | ICD-10-CM

## 2021-01-16 DIAGNOSIS — R310 Gross hematuria: Secondary | ICD-10-CM

## 2021-01-16 DIAGNOSIS — R339 Retention of urine, unspecified: Secondary | ICD-10-CM

## 2021-01-16 DIAGNOSIS — R972 Elevated prostate specific antigen [PSA]: Secondary | ICD-10-CM

## 2021-01-16 DIAGNOSIS — N529 Male erectile dysfunction, unspecified: Secondary | ICD-10-CM

## 2021-01-16 LAB — BLADDER SCAN AMB NON-IMAGING

## 2021-01-16 MED ORDER — TADALAFIL 20 MG PO TABS: 20.0000 mg | ORAL_TABLET | Freq: Every day | ORAL | 3 refills | Status: DC | PRN

## 2021-01-29 ENCOUNTER — Ambulatory Visit
Admission: RE | Admit: 2021-01-29 | Discharge: 2021-01-29 | Disposition: A | Discharge status: Home / Self Care | Payer: Medicare Other | Source: Ambulatory Visit | Attending: Urology | Admitting: Urology

## 2021-01-29 ENCOUNTER — Other Ambulatory Visit: Payer: Self-pay

## 2021-01-29 DIAGNOSIS — N3289 Other specified disorders of bladder: Secondary | ICD-10-CM | POA: Diagnosis not present

## 2021-01-29 DIAGNOSIS — N400 Enlarged prostate without lower urinary tract symptoms: Secondary | ICD-10-CM | POA: Diagnosis not present

## 2021-01-29 DIAGNOSIS — I700 Atherosclerosis of aorta: Secondary | ICD-10-CM | POA: Diagnosis not present

## 2021-01-29 DIAGNOSIS — R310 Gross hematuria: Secondary | ICD-10-CM | POA: Diagnosis not present

## 2021-01-29 LAB — POCT I-STAT CREATININE: Creatinine, Ser: 1 mg/dL (ref 0.61–1.24)

## 2021-01-29 MED ORDER — IOHEXOL 300 MG/ML  SOLN
125.0000 mL | Freq: Once | INTRAMUSCULAR | Status: AC | PRN
  Administered 2021-01-29: 125 mL via INTRAVENOUS

## 2021-02-04 ENCOUNTER — Other Ambulatory Visit: Payer: Medicare Other | Admitting: Urology

## 2021-02-04 NOTE — Progress Notes (Incomplete)
   02/04/2021  CC: No chief complaint on file.   HPI: EIDAN MUELLNER is a 75 y.o. male who presents to clinic for a cysto and discussion of his recent CTU from 01/29/2021.   There were no vitals taken for this visit. NED. A&Ox3.   No respiratory distress   Abd soft, NT, ND Normal phallus with bilateral descended testicles  Cystoscopy Procedure Note  Patient identification was confirmed, informed consent was obtained, and patient was prepped using Betadine solution.  Lidocaine jelly was administered per urethral meatus.     Pre-Procedure: - Inspection reveals a normal caliber ureteral meatus.  Procedure: The flexible cystoscope was introduced without difficulty - No urethral strictures/lesions are present. - {Blank multiple:19197::"Enlarged","Surgically absent","Normal"} prostate *** - {Blank multiple:19197::"Normal","Elevated","Tight"} bladder neck *** - Bilateral ureteral orifices identified - Bladder mucosa  reveals no ulcers, tumors, or lesions - No bladder stones - No trabeculation  Retroflexion shows ***   Post-Procedure: - Patient tolerated the procedure well  Assessment/ Plan:  1.    Follow Up: No follow-ups on file.   Jaclyn Shaggy, am acting as a scribe for Dr. Hollice Espy.    Ardyth Gal

## 2021-02-10 NOTE — Progress Notes (Signed)
   02/11/2021   CC:  Chief Complaint  Patient presents with  . Cysto    HPI: ASHDEN SONNENBERG is a 75 y.o. male with personal history of BPH, incomplete bladder emptying on CIC 3 times daily who presents today for further evaluation of gross hematuria.  He underwent routine CT urogram which showed significant prostamegaly with a intravesical median lobe and sequela of outlet obstruction.  No other GU anomalies were identified.   Blood pressure (!) 119/59, pulse 75, height 5\' 5"  (1.651 m), weight 126 lb (57.2 kg). NED. A&Ox3.   No respiratory distress   Abd soft, NT, ND Normal phallus with bilateral descended testicles  Cystoscopy Procedure Note  Patient identification was confirmed, informed consent was obtained, and patient was prepped using Betadine solution.  Lidocaine jelly was administered per urethral meatus.     Pre-Procedure: - Inspection reveals a normal caliber ureteral meatus.  Procedure: The flexible cystoscope was introduced without difficulty - No urethral strictures/lesions are present. - Enlarged prostate with trilobar coaptation - Elevated bladder neck  - Bilateral ureteral orifices identified - Bladder mucosa  reveals no ulcers, tumors, or lesions - No bladder stones -Severe trabeculation with saccules and a few smaller wid[place]outh diverticula  Retroflexion shows intravesical median lobe, moderate   Post-Procedure: - Patient tolerated the procedure well  Assessment/ Plan:  1. Benign prostatic hyperplasia with urinary obstruction Severe refractory BPH chronically managed with CIC as he has been reticent to pursue outlet procedure in the past  Cystoscopic findings today show evidence of severe bladder pathology secondary to chronic outlet obstruction  I have strongly urged him to consider an outlet procedure primarily for the purpose of bladder preservation but also ideally to allow him to empty spontaneously without the need for CIC.  Its possible  that at this point, he may still need to self cath due to his almost end-stage appearing bladder however will likely state improvement in his baseline urinary symptoms with relief of his outlet.  On his anatomy, prostate volume as well as presence of large median lobe, I be best served by holmium laser enucleation of the prostate.  Alternatives could also include TURP.  We discussed the common postoperative course following holep including need for overnight Foley catheter, temporary worsening of irritative voiding symptoms, and occasional stress incontinence which typically lasts up to 6 months but can persist. We discussed retrograde ejaculation and damage to surrounding structures including the urinary sphincter. Other uncommon complications including hematuria and urinary tract infection.  He understands all of the above and is willing to proceed as planned.  He is very concerned today about cost.  This is what has been preventing him from undergoing the procedure in the past.  - Urinalysis, Complete  2. Incomplete bladder emptying Continue CIC for now  3. Gross hematuria Likely secondary to prostamegaly/catheter trauma.  No other pathology identified as outlined above.   Hollice Espy, MD

## 2021-02-11 ENCOUNTER — Encounter: Payer: Self-pay | Admitting: Urology

## 2021-02-11 ENCOUNTER — Other Ambulatory Visit: Payer: Self-pay

## 2021-02-11 ENCOUNTER — Ambulatory Visit (INDEPENDENT_AMBULATORY_CARE_PROVIDER_SITE_OTHER): Payer: Medicare Other | Admitting: Urology

## 2021-02-11 VITALS — BP 119/59 | HR 75 | Ht 65.0 in | Wt 126.0 lb

## 2021-02-11 DIAGNOSIS — R310 Gross hematuria: Secondary | ICD-10-CM

## 2021-02-11 DIAGNOSIS — N401 Enlarged prostate with lower urinary tract symptoms: Secondary | ICD-10-CM

## 2021-02-11 DIAGNOSIS — R339 Retention of urine, unspecified: Secondary | ICD-10-CM

## 2021-02-11 DIAGNOSIS — N138 Other obstructive and reflux uropathy: Secondary | ICD-10-CM

## 2021-02-11 NOTE — Patient Instructions (Signed)

## 2021-02-12 LAB — MICROSCOPIC EXAMINATION: Bacteria, UA: NONE SEEN

## 2021-02-12 LAB — URINALYSIS, COMPLETE
Bilirubin, UA: NEGATIVE
Glucose, UA: NEGATIVE
Ketones, UA: NEGATIVE
Nitrite, UA: NEGATIVE
Protein,UA: NEGATIVE
RBC, UA: NEGATIVE
Specific Gravity, UA: 1.01 (ref 1.005–1.030)
Urobilinogen, Ur: 0.2 mg/dL (ref 0.2–1.0)
pH, UA: 5.5 (ref 5.0–7.5)

## 2021-02-17 ENCOUNTER — Other Ambulatory Visit: Payer: Self-pay | Admitting: *Deleted

## 2021-02-17 DIAGNOSIS — N401 Enlarged prostate with lower urinary tract symptoms: Secondary | ICD-10-CM

## 2021-02-17 DIAGNOSIS — N138 Other obstructive and reflux uropathy: Secondary | ICD-10-CM

## 2021-03-03 ENCOUNTER — Other Ambulatory Visit: Payer: Self-pay | Admitting: Urology

## 2021-03-03 DIAGNOSIS — N138 Other obstructive and reflux uropathy: Secondary | ICD-10-CM

## 2021-03-05 ENCOUNTER — Other Ambulatory Visit: Payer: Self-pay | Admitting: Urology

## 2021-03-09 ENCOUNTER — Other Ambulatory Visit: Payer: Self-pay

## 2021-03-09 ENCOUNTER — Encounter
Admission: RE | Admit: 2021-03-09 | Discharge: 2021-03-09 | Disposition: A | Discharge status: Home / Self Care | Payer: Medicare Other | Source: Ambulatory Visit | Attending: Urology | Admitting: Urology

## 2021-03-09 NOTE — Patient Instructions (Addendum)
Your procedure is scheduled on: 03/16/21 Report to [place]. (you must stop at the Admitting desk before going to 2nd floor) To find out your arrival time please call [phone] between 1PM - 3PM on 03/13/21.  Remember: Instructions that are not followed completely may result in serious medical risk, up to and including death, or upon the discretion of your surgeon and anesthesiologist your surgery may need to be rescheduled.     _X__ 1. Do not eat food or drink any liquids after midnight the night before your procedure.                 No gum chewing or hard candies.   __X__2.  On the morning of surgery brush your teeth with toothpaste and water, you                 may rinse your mouth with mouthwash if you wish.  Do not swallow any              toothpaste of mouthwash.     _X__ 3.  No Alcohol for 24 hours before or after surgery.   _X__ 4.  Do Not Smoke or use e-cigarettes For 24 Hours Prior to Your Surgery.                 Do not use any chewable tobacco products for at least 6 hours prior to                 surgery.  ____  5.  Bring all medications with you on the day of surgery if instructed.   __X__  6.  Notify your doctor if there is any change in your medical condition      (cold, fever, infections).     Do not wear jewelry, make-up, hairpins, clips or nail polish. Do not wear lotions, powders, or perfumes.  Do not shave 48 hours prior to surgery. Men may shave face and neck. Do not bring valuables to the hospital.    [facility] is not responsible for any belongings or valuables.  Contacts, dentures/partials or body piercings may not be worn into surgery. Bring a case for your contacts, glasses or hearing aids, a denture cup will be supplied. Leave your suitcase in the car. After surgery it may be brought to your room. For patients admitted to the hospital, discharge time is determined by your treatment  team.   Patients discharged the day of surgery will not be allowed to drive home.   Please read over the following fact sheets that you were given:     __X__ Take these medicines the morning of surgery with A SIP OF WATER:    1. tamsulosin (FLOMAX) 0.4 MG CAPS capsule  2.   3.   4.  5.  6.  ____ Fleet Enema (as directed)   ____ Use CHG Soap/SAGE wipes as directed  ____ Use inhalers on the day of surgery  ____ Stop metformin/Janumet/Farxiga 2 days prior to surgery    ____ Take 1/2 of usual insulin dose the night before surgery. No insulin the morning          of surgery.   ____ Stop Blood Thinners Coumadin/Plavix/Xarelto/Pleta/Pradaxa/Eliquis/Effient/Aspirin  on   Or contact your Surgeon, Cardiologist or Medical Doctor regarding  ability to stop your blood thinners  __X__ Stop Anti-inflammatories 7 days before surgery such as Advil, Ibuprofen, Motrin,  BC or Goodies Powder, Naprosyn,  Naproxen, Aleve   __X__ Stop all herbal supplements, fish oil or vitamin E until after surgery.    ____ Bring C-Pap to the hospital.     You may continue your daily 81 mg Aspirin, but do not take it the day of your prostate procedure.

## 2021-03-09 NOTE — Pre-Procedure Instructions (Signed)
Copy of clearance note in media 03/09/21 notes patient OK to continue aspirin prior to procedure per Dr Erlene Quan.

## 2021-03-10 ENCOUNTER — Other Ambulatory Visit: Payer: Medicare Other

## 2021-03-10 DIAGNOSIS — N138 Other obstructive and reflux uropathy: Secondary | ICD-10-CM

## 2021-03-10 DIAGNOSIS — N401 Enlarged prostate with lower urinary tract symptoms: Secondary | ICD-10-CM | POA: Diagnosis not present

## 2021-03-11 DIAGNOSIS — I252 Old myocardial infarction: Secondary | ICD-10-CM | POA: Diagnosis not present

## 2021-03-11 DIAGNOSIS — Z720 Tobacco use: Secondary | ICD-10-CM | POA: Diagnosis not present

## 2021-03-11 DIAGNOSIS — Z952 Presence of prosthetic heart valve: Secondary | ICD-10-CM | POA: Diagnosis not present

## 2021-03-11 DIAGNOSIS — N401 Enlarged prostate with lower urinary tract symptoms: Secondary | ICD-10-CM | POA: Diagnosis not present

## 2021-03-11 DIAGNOSIS — R011 Cardiac murmur, unspecified: Secondary | ICD-10-CM | POA: Diagnosis not present

## 2021-03-11 DIAGNOSIS — Z7689 Persons encountering health services in other specified circumstances: Secondary | ICD-10-CM | POA: Diagnosis not present

## 2021-03-11 DIAGNOSIS — R3911 Hesitancy of micturition: Secondary | ICD-10-CM | POA: Diagnosis not present

## 2021-03-11 DIAGNOSIS — Z01810 Encounter for preprocedural cardiovascular examination: Secondary | ICD-10-CM | POA: Diagnosis not present

## 2021-03-11 LAB — URINALYSIS, COMPLETE
Bilirubin, UA: NEGATIVE
Glucose, UA: NEGATIVE
Ketones, UA: NEGATIVE
Nitrite, UA: POSITIVE — AB
Specific Gravity, UA: 1.015 (ref 1.005–1.030)
Urobilinogen, Ur: 0.2 mg/dL (ref 0.2–1.0)
pH, UA: >9 — ABNORMAL HIGH (ref 5.0–7.5)

## 2021-03-11 LAB — MICROSCOPIC EXAMINATION: WBC, UA: >30 /hpf — AB (ref 0–5)

## 2021-03-12 ENCOUNTER — Encounter: Payer: Self-pay | Admitting: Urology

## 2021-03-12 NOTE — Progress Notes (Signed)
Perioperative Services  Pre-Admission/Anesthesia Testing Clinical Review  Date: 03/13/21  Patient Demographics:  Name: Michael Edwards DOB:   Mar 18, 1946 MRN:   [id number]  Planned Surgical Procedure(s):    Case: [id number] Date/Time: 03/16/21 1213   Procedure: HOLEP-LASER ENUCLEATION OF THE PROSTATE WITH MORCELLATION (N/A )   Anesthesia type: Choice   Pre-op diagnosis: BPH with Bladder outlet Obstruction   Location: [place] OR ROOM 10 / [place] [facility] FOR AN[facility]ESIA [facility]   Surgeons: Hollice Espy, MD    NOTE: Available PAT nursing documentation and vital signs have been reviewed. Clinical nursing staff has updated patient's PMH/PSHx, current medication list, and drug allergies/intolerances to ensure comprehensive history available to assist in medical decision making as it pertains to the aforementioned surgical procedure and anticipated anesthetic course.   Clinical Discussion:  Michael Edwards is a 75 y.o. male who is submitted for pre-surgical anesthesia review and clearance prior to him undergoing the above procedure. Patient is a Current Smoker (58 pack years) she uses. Pertinent PMH includes: CAD, STEMI, cardiac murmur, severe aortic stenosis (s/p TAVR), HLD, BPH.  Patient is followed by cardiology Clayborn Bigness, MD). He was last seen in the cardiology clinic on 03/11/2021; notes reviewed.  At the time of his clinic visit, patient doing well overall from a cardiovascular perspective.  He denied any episodes of chest pain, shortness of breath, PND, orthopnea, palpitations, peripheral edema, vertiginous symptoms, or presyncope/syncope.  Patient with a significant cardiovascular history.     Patient reported to have suffered an NSTEMI "many years ago" in [place].  Per his report, no left heart catheterization for PCI was performed; treated medically.     Noninvasive cardiovascular testing performed on 09/18/2015 included a TTE that revealed normal left ventricular systolic function (EF 26%)  with moderate aortic valve stenosis and mild LVH.  Subsequent myocardial perfusion imaging study demonstrated an EF of 60%, no regional wall motion abnormalities, and no stress induced myocardial ischemia or arrhythmia.   TTE performed on 01/10/2019 revealed severe calcification and stenosis of the aortic valve.  Left ventricular systolic function normal with an EF of 60-65%.  Subsequent right heart catheterization performed on 01/12/2019 revealed near normal coronary anatomy with no evidence of obstructive CAD.  Catheterization demonstrated a heavily calcified aortic valve with severely restricted motion.  Filling and pulmonary pressures normal.  Cardiac output high.   Given patient's diagnosis of severe aortic valve stenosis, he underwent a TAVR procedure on 01/30/2019.  Follow-up TTE was performed on POD-1 revealing normal left ventricular systolic function with an EF of 60-65%.  Michael Edwards valve well-positioned with a mean gradient of 14 mmHg; no significant stenosis suspected.   Last TTE performed on 03/19/2019 demonstrated normal left ventricular systolic function with an EF of 60 to 65%.  Following AVR, mean gradient 20 mmHg with no aortic insufficiency noted.  Blood pressure well controlled.  Patient has not HLD diagnosis that is controlled with diet lifestyle modification.  In review of his medication list, it is noted the patient is on a PDE5i for his ED diagnosis.  Functional capacity, as defined by DASI, is documented as being >/= 4 METS.  No changes were made to his medication regimen.  Patient to follow-up with outpatient cardiology in Edwards months or sooner if needed.  Patient is scheduled for a urological procedure on 03/16/2021 with Dr. Hollice Espy.  Given patient's past medical history significant for cardiovascular diagnoses, presurgical cardiac clearance was sought by the performing surgeon's office and PAT team.  Per cardiology, "patient  is cleared from a cardiac perspective as an  ACCEPTABLE risk for prostate surgery at this time.  No further cardiovascular testing required prior to his procedure".  This patient is on daily antiplatelet therapy. He has been cleared by cardiology to hold daily low dose ASA if needed. Surgeon ok with patient continuing medication throughout the perioperative period.   Patient denies previous perioperative complications with anesthesia in the past. In review of the available records, it is noted that patient underwent a MAC anesthetic course at [facility] ([facility]) in 12/2018 without documented complications.   Vitals with BMI 03/09/2021 02/11/2021 Edwards/18/2022  Height _0  _1  _2   Weight 126 lbs 126 lbs 126 lbs  BMI 20.97 41.96 22.29  Systolic - 798 921  Diastolic - 59 61  Pulse - 75 75    Providers/Specialists:   NOTE: Primary physician provider listed below. Patient may have been seen by APP or partner within same practice.   PROVIDER ROLE / SPECIALTY LAST Lu Duffel, MD  Urology (Surgeon)  02/11/2021  Perrin Maltese, MD  Primary Care Provider  ???  Katrine Coho, MD  Cardiology  03/11/2021   Allergies:  Patient has no known allergies.  Current Home Medications:   No current facility-administered medications for this encounter.   Marland Kitchen aspirin EC 81 MG tablet  . Multiple Vitamin (MULTIVITAMIN) tablet  . tadalafil (CIALIS) 20 MG tablet  . mupirocin ointment (BACTROBAN) 2 %  . tamsulosin (FLOMAX) 0.4 MG CAPS capsule   History:   Past Medical History:  Diagnosis Date  . CAD (coronary artery disease)   . Cardiac murmur   . ED (erectile dysfunction)   . Elevated PSA    10.6 on 07/2015  . Enlarged prostate   . HLD (hyperlipidemia)   . Incomplete bladder emptying 07/27/2016  . S/P TAVR (transcatheter aortic valve replacement) Edwards/31/2020   23 mm Edwards Michael Edwards transcatheter heart valve placed via percutaneous right transfemoral approach   . Self-catheterizes urinary bladder   . Severe aortic stenosis  08/19/2015  . Squamous cell carcinoma of skin 08/04/2020   Right thumb. SCCis, hypertrophic. shave removal and EDC 10/21/20  . STEMI (ST elevation myocardial infarction) ([place])    "many years ago" in [place]; no LHC for PCI; treated medically per patient report  . Tobacco abuse    Past Surgical History:  Procedure Laterality Date  . ANKLE FRACTURE SURGERY Left   . blood clot removed from left upper leg    . INGUINAL HERNIA REPAIR Left 02/03/2017   Procedure: HERNIA REPAIR INGUINAL ADULT;  Surgeon: Clayburn Pert, MD;  Location: [facility] ORS;  Service: General;  Laterality: Left;  . RIGHT HEART CATH AND CORONARY ANGIOGRAPHY N/A Edwards/13/2020   Procedure: RIGHT HEART CATH AND CORONARY ANGIOGRAPHY;  Surgeon: Wellington Hampshire, MD;  Location: [place] CV LAB;  Service: Cardiovascular;  Laterality: N/A;  . TEE WITHOUT CARDIOVERSION N/A Edwards/31/2020   Procedure: TRANSESOPHAGEAL ECHOCARDIOGRAM (TEE);  Surgeon: Burnell Blanks, MD;  Location: [place] CV LAB;  Service: Open Heart Surgery;  Laterality: N/A;  . TONSILLECTOMY    . TRANSCATHETER AORTIC VALVE REPLACEMENT, TRANSFEMORAL N/A Edwards/31/2020   Procedure: TRANSCATHETER AORTIC VALVE REPLACEMENT, TRANSFEMORAL;  Surgeon: Burnell Blanks, MD;  Location: [place] CV LAB;  Service: Open Heart Surgery;  Laterality: N/A;  . VASECTOMY     Family History  Problem Relation Age of Onset  . Hematuria Mother   . Peripheral Artery Disease Father   .  Diabetes Sister   . Heart failure Sister    Social History   Tobacco Use  . Smoking status: Current Every Day Smoker    Packs/day: 1.00    Years: 58.00    Pack years: 58.00  . Smokeless tobacco: Never Used  Vaping Use  . Vaping Use: Never used  Substance Use Topics  . Alcohol use: Yes    Comment: 6 beers a week  . Drug use: No    Pertinent Clinical Results:  LABS: Labs reviewed: Acceptable for surgery.  No visits with results within Edwards Day(s) from this visit.  Latest known visit  with results is:  Lab on 03/10/2021  Component Date Value Ref Range Status  . Specific Gravity, UA 03/10/2021 1.015  1.005 - 1.030 Final  . pH, UA 03/10/2021 >9.0* 5.0 - 7.5 Final  . Color, UA 03/10/2021 Yellow  Yellow Final  . Appearance Ur 03/10/2021 Cloudy* Clear Final  . Leukocytes,UA 03/10/2021 2+* Negative Final  . Protein,UA 03/10/2021 Edwards+* Negative/Trace Final  . Glucose, UA 03/10/2021 Negative  Negative Final  . Ketones, UA 03/10/2021 Negative  Negative Final  . RBC, UA 03/10/2021 2+* Negative Final  . Bilirubin, UA 03/10/2021 Negative  Negative Final  . Urobilinogen, Ur 03/10/2021 0.2  0.2 - 1.0 mg/dL Final  . Nitrite, UA 03/10/2021 Positive* Negative Final  . Microscopic Examination 03/10/2021 See below:   Final  . Urine Culture, Comprehensive 03/10/2021 Preliminary report*  Preliminary  . Organism ID, Bacteria 03/10/2021 Gram negative rods*  Preliminary   Greater than 100,000 colony forming units per mL  . WBC, UA 03/10/2021 >30* 0 - 5 /hpf Final  . RBC 03/10/2021 11-30* 0 - 2 /hpf Final  . Epithelial Cells (non renal) 03/10/2021 0-10  0 - 10 /hpf Final  . Casts 03/10/2021 Present* None seen /lpf Final  . Cast Type 03/10/2021 Granular casts* N/A Final  . Bacteria, UA 03/10/2021 Many* None seen/Few Final   CBC on 11/13/2020 WBC 11.7 Hgb 15.1 Hct 43.5 Plts 240  CMP on 11/13/2020 Na 142 K+ 5.1 Chloride 107 Glucose 93 BUN 15 Creatinine 1.08 eGFR 66 Ca 9.4 T-bili 0.2 ALP 71 AST 13 ALT 12   ECG: Date: 03/11/2021 Rate: 73 bpm Rhythm: normal sinus Axis (leads I and aVF): Normal Intervals: PR 146 ms. QRS 96 ms. QTc 423 ms. ST segment and T wave changes: No evidence of acute ST segment elevation or depression; possible age undetermined infarct noted NOTE: Tracing obtained at [facility]; unable for review. Above based on cardiologist's interpretation.    IMAGING / PROCEDURES: ECHOCARDIOGRAM performed on 03/19/2019 1. The left ventricle has normal  systolic function with an ejection fraction of 60-65%. The cavity size was normal.  2. Left ventricular diastolic Doppler parameters are consistent with impaired relaxation.  Edwards. The right ventricle has normal systolic function. The cavity was normal.  4. The mitral valve is grossly normal. There is mild mitral annular calcification present.  5. The tricuspid valve is grossly normal.  6. Normal LV systolic function; mild diastolic dysfunction; mild proximal septal thickening; s/p AVR with mean gradient of 20 mmHg and no AI.   CARDIAC CTA performed on 01/26/2019 1. Aortic valve is trileaflet, functionally bileaflet with 9 separated left and right coronary leaflets.  There are severely thickened and calcified leaflets, and severe leaflet opening restrictions with minimal calcifications extending into the LVOT under the noncoronary sinus. 2. Calcium score of the aortic valve is 2448 consistent with severe aortic stenosis. Edwards. Annular measurements suitable  for delivery of a 23 mm Edwards Michael Edwards valve 4. Sufficient coronary to annulus distance 5. Optimal fluoroscopic angle for delivery: LAO 9 CAU 8 6. No thrombus in the LAA  RIGHT HEART CATHETERIZATION AND CORONARY ANGIOGRAPHY performed on 01/12/2019 1. Normal coronary arteries with no evidence of obstructive coronary artery disease 2. Heavily calcified aortic valve with severely restricted motion.  Aortic stenosis is known to be severe by echo. Edwards. Right heart catheterization showed normal filling pressures, normal pulmonary pressure, and high cardiac output  ECHOCARDIOGRAM performed on 01/10/2019 1. The aortic valve has an indeterminate number of cusps  2. Moderate thickening of the aortic valve  Edwards. Severe calcification of the aortic valve.  4. Severe stenosis of the aortic valve; mean gradient 46 mmHg, peak gradient 78 mmHg, peak velocity 4.4 m/s, AVA 0.83 cm2.  5. The left ventricle has normal systolic function with an ejection fraction of  60-65%. The cavity size was normal.  6. Left ventricular diastolic Doppler parameters are consistent with impaired relaxation.  7. No evidence of left ventricular regional wall motion abnormalities.  8. The right ventricle has normal systolic function. The cavity was normal.  9. There is no increase in right ventricular wall thickness.  10. Left atrial size was mildly dilated.  11. The tricuspid valve is grossly normal.  LEXISCAN performed on 09/18/2015 1. LVEF 60% 2. Regional wall motion reveals normal myocardial thickening and wall motion Edwards. No artifacts noted 4. Left-ventricular cavity size normal 5. No evidence of stress-induced myocardial ischemia or arrhythmia 6. The overall quality of the study is good  Impression and Plan:  Michael Edwards has been referred for pre-anesthesia review and clearance prior to him undergoing the planned anesthetic and procedural courses. Available labs, pertinent testing, and imaging results were personally reviewed by me. This patient has been appropriately cleared by cardiology with an overall ACCEPTABLE risk of significant perioperative cardiovascular complications.  Based on clinical review performed today (03/13/21), barring any significant acute changes in the patient's overall condition, it is anticipated that he will be able to proceed with the planned surgical intervention. Any acute changes in clinical condition may necessitate his procedure being postponed and/or cancelled. Patient will meet with anesthesia team (MD and/or CRNA) on the day of his procedure for preoperative evaluation/assessment. Questions regarding anesthetic course will be fielded at that time.   Pre-surgical instructions were reviewed with the patient during his PAT appointment and questions were fielded by PAT clinical staff. Patient was advised that if any questions or concerns arise prior to his procedure then he should return a call to PAT and/or his surgeon's office to  discuss.  Honor Loh, MSN, APRN, FNP-C, CEN [facility]  Peri-operative Services Nurse Practitioner Phone: [phone] 03/13/21 10:15 AM  NOTE: This note has been prepared using Dragon dictation software. Despite my best ability to proofread, there is always the potential that unintentional transcriptional errors may still occur from this process.

## 2021-03-16 ENCOUNTER — Ambulatory Visit: Payer: Medicare Other | Admitting: Urgent Care

## 2021-03-16 ENCOUNTER — Ambulatory Visit
Admission: RE | Admit: 2021-03-16 | Discharge: 2021-03-16 | Disposition: A | Discharge status: Home / Self Care | Payer: Medicare Other | Attending: Urology | Admitting: Urology

## 2021-03-16 ENCOUNTER — Encounter: Payer: Self-pay | Admitting: Urology

## 2021-03-16 ENCOUNTER — Other Ambulatory Visit: Payer: Self-pay

## 2021-03-16 ENCOUNTER — Encounter
Admission: RE | Disposition: A | Discharge status: Home / Self Care | Payer: Self-pay | Source: Home / Self Care | Attending: Urology

## 2021-03-16 DIAGNOSIS — R338 Other retention of urine: Secondary | ICD-10-CM | POA: Diagnosis not present

## 2021-03-16 DIAGNOSIS — N320 Bladder-neck obstruction: Secondary | ICD-10-CM | POA: Insufficient documentation

## 2021-03-16 DIAGNOSIS — R310 Gross hematuria: Secondary | ICD-10-CM | POA: Diagnosis not present

## 2021-03-16 DIAGNOSIS — F17210 Nicotine dependence, cigarettes, uncomplicated: Secondary | ICD-10-CM | POA: Diagnosis not present

## 2021-03-16 DIAGNOSIS — Z952 Presence of prosthetic heart valve: Secondary | ICD-10-CM | POA: Insufficient documentation

## 2021-03-16 DIAGNOSIS — C61 Malignant neoplasm of prostate: Secondary | ICD-10-CM | POA: Insufficient documentation

## 2021-03-16 DIAGNOSIS — R001 Bradycardia, unspecified: Secondary | ICD-10-CM | POA: Diagnosis not present

## 2021-03-16 DIAGNOSIS — N138 Other obstructive and reflux uropathy: Secondary | ICD-10-CM | POA: Diagnosis not present

## 2021-03-16 DIAGNOSIS — N401 Enlarged prostate with lower urinary tract symptoms: Secondary | ICD-10-CM | POA: Diagnosis not present

## 2021-03-16 HISTORY — DX: Atherosclerotic heart disease of native coronary artery without angina pectoris: I25.10

## 2021-03-16 HISTORY — DX: Hyperlipidemia, unspecified: E78.5

## 2021-03-16 HISTORY — DX: ST elevation (STEMI) myocardial infarction of unspecified site: I21.3

## 2021-03-16 HISTORY — PX: HOLEP-LASER ENUCLEATION OF THE PROSTATE WITH MORCELLATION: SHX6641

## 2021-03-16 LAB — CULTURE, URINE COMPREHENSIVE

## 2021-03-16 SURGERY — ENUCLEATION, PROSTATE, USING LASER, WITH MORCELLATION
Anesthesia: General

## 2021-03-16 MED ORDER — DEXMEDETOMIDINE (PRECEDEX) IN NS 20 MCG/5ML (4 MCG/ML) IV SYRINGE
PREFILLED_SYRINGE | INTRAVENOUS | Status: DC | PRN
  Administered 2021-03-16: 8 ug via INTRAVENOUS

## 2021-03-16 MED ORDER — OXYBUTYNIN CHLORIDE 5 MG PO TABS: 5.0000 mg | ORAL_TABLET | Freq: Three times a day (TID) | ORAL | 0 refills | Status: DC | PRN

## 2021-03-16 MED ORDER — CEFAZOLIN SODIUM-DEXTROSE 2-4 GM/100ML-% IV SOLN
INTRAVENOUS | Status: AC
  Filled 2021-03-16: qty 100

## 2021-03-16 MED ORDER — LACTATED RINGERS IV SOLN: INTRAVENOUS | Status: DC

## 2021-03-16 MED ORDER — CHLORHEXIDINE GLUCONATE 0.12 % MT SOLN
OROMUCOSAL | Status: AC
  Administered 2021-03-16: 15 mL via OROMUCOSAL
  Filled 2021-03-16: qty 15

## 2021-03-16 MED ORDER — SULFAMETHOXAZOLE-TRIMETHOPRIM 800-160 MG PO TABS: 1.0000 | ORAL_TABLET | Freq: Two times a day (BID) | ORAL | 0 refills | Status: DC

## 2021-03-16 MED ORDER — SUGAMMADEX SODIUM 200 MG/2ML IV SOLN
INTRAVENOUS | Status: DC | PRN
  Administered 2021-03-16: 200 mg via INTRAVENOUS

## 2021-03-16 MED ORDER — FAMOTIDINE 20 MG PO TABS
ORAL_TABLET | ORAL | Status: AC
  Administered 2021-03-16: 20 mg via ORAL
  Filled 2021-03-16: qty 1

## 2021-03-16 MED ORDER — HYDROCODONE-ACETAMINOPHEN 5-325 MG PO TABS: 1.0000 | ORAL_TABLET | Freq: Four times a day (QID) | ORAL | 0 refills | Status: DC | PRN

## 2021-03-16 MED ORDER — ACETAMINOPHEN 10 MG/ML IV SOLN
INTRAVENOUS | Status: DC | PRN
  Administered 2021-03-16: 1000 mg via INTRAVENOUS

## 2021-03-16 MED ORDER — PHENYLEPHRINE HCL (PRESSORS) 10 MG/ML IV SOLN
INTRAVENOUS | Status: DC | PRN
  Administered 2021-03-16: 50 ug via INTRAVENOUS
  Administered 2021-03-16: 100 ug via INTRAVENOUS

## 2021-03-16 MED ORDER — FENTANYL CITRATE (PF) 100 MCG/2ML IJ SOLN
INTRAMUSCULAR | Status: AC
  Filled 2021-03-16: qty 2

## 2021-03-16 MED ORDER — PROPOFOL 10 MG/ML IV BOLUS
INTRAVENOUS | Status: DC | PRN
  Administered 2021-03-16: 120 mg via INTRAVENOUS

## 2021-03-16 MED ORDER — CHLORHEXIDINE GLUCONATE 0.12 % MT SOLN: 15.0000 mL | Freq: Once | OROMUCOSAL | Status: AC

## 2021-03-16 MED ORDER — FUROSEMIDE 10 MG/ML IJ SOLN
INTRAMUSCULAR | Status: DC | PRN
  Administered 2021-03-16: 10 mg via INTRAMUSCULAR

## 2021-03-16 MED ORDER — LIDOCAINE HCL (CARDIAC) PF 100 MG/5ML IV SOSY
PREFILLED_SYRINGE | INTRAVENOUS | Status: DC | PRN
  Administered 2021-03-16: 80 mg via INTRAVENOUS

## 2021-03-16 MED ORDER — ONDANSETRON HCL 4 MG/2ML IJ SOLN: 4.0000 mg | Freq: Once | INTRAMUSCULAR | Status: DC | PRN

## 2021-03-16 MED ORDER — FAMOTIDINE 20 MG PO TABS: 20.0000 mg | ORAL_TABLET | Freq: Once | ORAL | Status: AC

## 2021-03-16 MED ORDER — CEFAZOLIN SODIUM-DEXTROSE 2-4 GM/100ML-% IV SOLN
2.0000 g | INTRAVENOUS | Status: AC
  Administered 2021-03-16: 2 g via INTRAVENOUS

## 2021-03-16 MED ORDER — FENTANYL CITRATE (PF) 100 MCG/2ML IJ SOLN
25.0000 ug | INTRAMUSCULAR | Status: DC | PRN
  Administered 2021-03-16 (×2): 25 ug via INTRAVENOUS

## 2021-03-16 MED ORDER — ROCURONIUM BROMIDE 100 MG/10ML IV SOLN
INTRAVENOUS | Status: DC | PRN
  Administered 2021-03-16: 10 mg via INTRAVENOUS
  Administered 2021-03-16: 20 mg via INTRAVENOUS
  Administered 2021-03-16 (×2): 10 mg via INTRAVENOUS

## 2021-03-16 MED ORDER — ONDANSETRON HCL 4 MG/2ML IJ SOLN
INTRAMUSCULAR | Status: DC | PRN
  Administered 2021-03-16: 4 mg via INTRAVENOUS

## 2021-03-16 MED ORDER — ORAL CARE MOUTH RINSE: 15.0000 mL | Freq: Once | OROMUCOSAL | Status: AC

## 2021-03-16 MED ORDER — DEXAMETHASONE SODIUM PHOSPHATE 10 MG/ML IJ SOLN
INTRAMUSCULAR | Status: DC | PRN
  Administered 2021-03-16: 10 mg via INTRAVENOUS

## 2021-03-16 MED ORDER — SUCCINYLCHOLINE CHLORIDE 20 MG/ML IJ SOLN
INTRAMUSCULAR | Status: DC | PRN
  Administered 2021-03-16: 100 mg via INTRAVENOUS

## 2021-03-16 MED ORDER — FUROSEMIDE 10 MG/ML IJ SOLN
INTRAMUSCULAR | Status: AC
  Filled 2021-03-16: qty 4

## 2021-03-16 MED ORDER — FENTANYL CITRATE (PF) 100 MCG/2ML IJ SOLN
INTRAMUSCULAR | Status: DC | PRN
  Administered 2021-03-16 (×2): 50 ug via INTRAVENOUS

## 2021-03-16 MED ORDER — DEXMEDETOMIDINE (PRECEDEX) IN NS 20 MCG/5ML (4 MCG/ML) IV SYRINGE
PREFILLED_SYRINGE | INTRAVENOUS | Status: AC
  Filled 2021-03-16: qty 5

## 2021-03-16 MED ORDER — ACETAMINOPHEN 10 MG/ML IV SOLN
INTRAVENOUS | Status: AC
  Filled 2021-03-16: qty 100

## 2021-03-16 SURGICAL SUPPLY — 34 items
ADAPTER IRRIG TUBE 2 SPIKE SOL (ADAPTER) ×4 IMPLANT
ADPR TBG 2 SPK PMP STRL ASCP (ADAPTER) ×2
BAG DRN LRG CPC RND TRDRP CNTR (MISCELLANEOUS)
BAG DRN RND TRDRP ANRFLXCHMBR (UROLOGICAL SUPPLIES)
BAG URINE DRAIN 2000ML AR STRL (UROLOGICAL SUPPLIES) IMPLANT
BAG URO DRAIN 4000ML (MISCELLANEOUS) IMPLANT
CATH FOL 2WAY LX 20X30 (CATHETERS) IMPLANT
CATH FOL 2WAY LX 22X30 (CATHETERS) ×2 IMPLANT
CATH FOLEY 3WAY 30CC 22FR (CATHETERS) IMPLANT
CATH URETL 5X70 OPEN END (CATHETERS) ×2 IMPLANT
CONTAINER COLLECT MORCELLATR (MISCELLANEOUS) ×1 IMPLANT
DRAPE 3/4 80X56 (DRAPES) ×2 IMPLANT
DRAPE UTILITY 15X26 TOWEL STRL (DRAPES) IMPLANT
FIBER LASER MOSES 550 DFL (Laser) ×2 IMPLANT
FILTER OVERFLOW MORCELLATOR (FILTER) ×1 IMPLANT
GLOVE SURG ENC MOIS LTX SZ6.5 (GLOVE) ×4 IMPLANT
GOWN STRL REUS W/ TWL LRG LVL3 (GOWN DISPOSABLE) ×2 IMPLANT
GOWN STRL REUS W/TWL LRG LVL3 (GOWN DISPOSABLE) ×4
HOLDER FOLEY CATH W/STRAP (MISCELLANEOUS) ×2 IMPLANT
IV NS IRRIG 3000ML ARTHROMATIC (IV SOLUTION) ×32 IMPLANT
KIT TURNOVER CYSTO (KITS) ×2 IMPLANT
MBRN O SEALING YLW 17 FOR INST (MISCELLANEOUS) ×2
MEMBRANE SLNG YLW 17 FOR INST (MISCELLANEOUS) ×1 IMPLANT
MORCELLATOR COLLECT CONTAINER (MISCELLANEOUS) ×2
MORCELLATOR OVERFLOW FILTER (FILTER) ×2
MORCELLATOR ROTATION 4.75 335 (MISCELLANEOUS) ×2 IMPLANT
PACK CYSTO AR (MISCELLANEOUS) ×2 IMPLANT
SET CYSTO W/LG BORE CLAMP LF (SET/KITS/TRAYS/PACK) IMPLANT
SET IRRIG Y TYPE TUR BLADDER L (SET/KITS/TRAYS/PACK) ×2 IMPLANT
SLEEVE PROTECTION STRL DISP (MISCELLANEOUS) ×4 IMPLANT
SYR TOOMEY IRRIG 70ML (MISCELLANEOUS) ×2
SYRINGE TOOMEY IRRIG 70ML (MISCELLANEOUS) ×1 IMPLANT
TUBE PUMP MORCELLATOR PIRANHA (TUBING) ×2 IMPLANT
WATER STERILE IRR 1000ML POUR (IV SOLUTION) ×2 IMPLANT

## 2021-03-16 NOTE — H&P (Signed)
   H&P updated today 03/12/21  RRR CTAB  UCx 100k GNR, final pending c/w chronic colonization, will treat intra and post op as a precaution    CC:     Chief Complaint  Patient presents with  . Cysto    HPI: Michael Edwards is a 75 y.o. male with personal history of BPH, incomplete bladder emptying on CIC 3 times daily who presents today for further evaluation of gross hematuria.  He underwent routine CT urogram which showed significant prostamegaly with a intravesical median lobe and sequela of outlet obstruction.  No other GU anomalies were identified.   Blood pressure (!) 119/59, pulse 75, height 5\' 5"  (1.651 m), weight 126 lb (57.2 kg). NED. A&Ox3.   No respiratory distress   Abd soft, NT, ND Normal phallus with bilateral descended testicles  Cystoscopy Procedure Note  Patient identification was confirmed, informed consent was obtained, and patient was prepped using Betadine solution.  Lidocaine jelly was administered per urethral meatus.     Pre-Procedure: - Inspection reveals a normal caliber ureteral meatus.  Procedure: The flexible cystoscope was introduced without difficulty - No urethral strictures/lesions are present. - Enlarged prostate with trilobar coaptation - Elevated bladder neck  - Bilateral ureteral orifices identified - Bladder mucosa  reveals no ulcers, tumors, or lesions - No bladder stones -Severe trabeculation with saccules and a few smaller wid[place] diverticula  Retroflexion shows intravesical median lobe, moderate   Post-Procedure: - Patient tolerated the procedure well  Assessment/ Plan:  1. Benign prostatic hyperplasia with urinary obstruction Severe refractory BPH chronically managed with CIC as he has been reticent to pursue outlet procedure in the past  Cystoscopic findings today show evidence of severe bladder pathology secondary to chronic outlet obstruction  I have strongly urged him to consider an  outlet procedure primarily for the purpose of bladder preservation but also ideally to allow him to empty spontaneously without the need for CIC.  Its possible that at this point, he may still need to self cath due to his almost end-stage appearing bladder however will likely state improvement in his baseline urinary symptoms with relief of his outlet.  On his anatomy, prostate volume as well as presence of large median lobe, I be best served by holmium laser enucleation of the prostate.  Alternatives could also include TURP.  We discussed the common postoperative course following holep including need for overnight Foley catheter, temporary worsening of irritative voiding symptoms, and occasional stress incontinence which typically lasts up to 6 months but can persist. We discussed retrograde ejaculation and damage to surrounding structures including the urinary sphincter. Other uncommon complications including hematuria and urinary tract infection.  He understands all of the above and is willing to proceed as planned.  He is very concerned today about cost.  This is what has been preventing him from undergoing the procedure in the past.  - Urinalysis, Complete  2. Incomplete bladder emptying Continue CIC for now  3. Gross hematuria Likely secondary to prostamegaly/catheter trauma.  No other pathology identified as outlined above.   Hollice Espy, MD

## 2021-03-16 NOTE — Anesthesia Preprocedure Evaluation (Addendum)
Anesthesia Evaluation  Patient identified by MRN, date of birth, ID band Patient awake    Reviewed: Allergy & Precautions, NPO status , Patient's Chart, lab work & pertinent test results, reviewed documented beta blocker date and time   Airway Mallampati: II  TM Distance: >3 FB     Dental  (+) Upper Dentures, Lower Dentures   Pulmonary shortness of breath, Current Smoker and Patient abstained from smoking.,           Cardiovascular + CAD and + Past MI  + Valvular Problems/Murmurs AS      Neuro/Psych negative neurological ROS  negative psych ROS   GI/Hepatic negative GI ROS,   Endo/Other    Renal/GU  Bladder dysfunction      Musculoskeletal   Abdominal   Peds  Hematology   Anesthesia Other Findings   Reproductive/Obstetrics                             Anesthesia Physical  Anesthesia Plan  ASA: III  Anesthesia Plan: General   Post-op Pain Management:    Induction: Intravenous  PONV Risk Score and Plan:   Airway Management Planned: Oral ETT  Additional Equipment:   Intra-op Plan:   Post-operative Plan: Extubation in OR  Informed Consent: I have reviewed the patients History and Physical, chart, labs and discussed the procedure including the risks, benefits and alternatives for the proposed anesthesia with the patient or authorized representative who has indicated his/her understanding and acceptance.     Dental advisory given  Plan Discussed with: CRNA and Surgeon  Anesthesia Plan Comments:       Anesthesia Quick Evaluation

## 2021-03-16 NOTE — Op Note (Signed)
Date of procedure: 03/16/21  Preoperative diagnosis:  1. BPH with BOO 2. Chronic urinary retention  Postoperative diagnosis:  1. same   Procedure: 1. HoLEP with morcellation  Surgeon: Hollice Espy, MD  Anesthesia: General  Complications: None  Intraoperative findings: Heavily trabeculated bladder with elevated bladder neck along with discrete median lobe  EBL: 100 cc  Specimens: Prostate chips  Drains: 101 French two-way Foley catheter with 55 cc in the balloon  Indication: Michael Edwards is a 75 y.o. patient with chronic urinary retention managed by CIC.  After reviewing the management options for treatment, he elected to proceed with the above surgical procedure(s). We have discussed the potential benefits and risks of the procedure, side effects of the proposed treatment, the likelihood of the patient achieving the goals of the procedure, and any potential problems that might occur during the procedure or recuperation. Informed consent has been obtained.  Description of procedure:  The patient was taken to the operating room and general anesthesia was induced.  The patient was placed in the dorsal lithotomy position, prepped and draped in the usual sterile fashion, and preoperative antibiotics were administered. A preoperative time-out was performed.     A 26 French resectoscope sheath using a blunt angled obturator was introduced without difficulty into the bladder.  The bladder was carefully inspected and noted to be moderately trabeculated.  There is an elevated bladder neck with a very small intravesical component.    Due to significant elevated bladder neck, initially was not able to visualize the UOs..  The prostatic fossa had significant trilobar coaptation with greater than 5 cm prostatic length.  A 550 m laser fiber was then brought in and using settings of 1 J's and 50 Hz, 2 incisions were created at the 5:00 and 7:00 positions of the bladder neck on either side of  the median lobe down to the level of the bladder neck/capsular fibers.  The incision was carried down caudally meeting in the midline just above the verumontanum.  The median lobe was then enucleated from a caudal to cranial direction cleaving the adenoma off the underlying capsule rolling it towards the bladder neck and ultimately cleaving the mucosa to free the median lobe into the bladder.  With a median lobe now removed, I was in fact able to visualize the ureteral orifice x 2 which were good distance from the bladder neck.   Next, a semilunar incision was created at the prostatic apex on the left side again freeing up the adenoma from the underlying capsule.  Care was taken to avoid any resection past the verumontanum.  This incision was carried around laterally and cranially towards the bladder neck.  Ultimately, I was able to complete the anterior commissure mucosa and the adenoma into the bladder creating a widely patent prostatic fossa.     Next, the same similar incision was created at the right prostatic apex.  This adenoma however ended up being enucleated and more of a piece wise fashion freeing up a large BPH nodules from the capsular fibers.  Once this was completed and cleared from the bladder neck, the prostatic fossa was noted to be widely patent.  Hemostasis was achieved using hemostatic fiber settings.  Bilateral UOs were visualized and free of any injury.  Finally, the 48 French resectoscope was exchanged for nephroscope and using the Piranha handpiece morcellator, the bladder was distended in each of the prostate chips were evacuated.  The bladder was irrigated several times and smaller joules were  clear for the bladder.  This point time, there were no residual fibers appreciated in the bladder.  Hemostasis was adequate.  10 mg of IV Lasix was administered to help with postoperative diuresis.  A 22 French two-way Foley catheter was then inserted over a catheter guide with 55 cc in the  balloon.  The catheter irrigated easily and well.  Patient was then clean and dry, repositioned supine position, reversed from anesthesia, taken to PACU in stable condition.   Plan: Patient will return to the office in 1 week for voiding trial with the PA.  I will see him in 6 weeks for PVR/IPSS.    Hollice Espy, M.D.

## 2021-03-16 NOTE — Discharge Instructions (Signed)
Holmium Laser Enucleation of the Prostate (HoLEP)  HoLEP is a treatment for men with benign prostatic hyperplasia (BPH). The laser surgery removed blockages of urine flow, and is done without any incisions on the body.     What is HoLEP?  HoLEP is a type of laser surgery used to treat obstruction (blockage) of urine flow as a result of benign prostatic hyperplasia (BPH). In men with BPH, the prostate gland is not cancerous, but has become enlarged. An enlarged prostate can result in a number of urinary tract symptoms such as weak urinary stream, difficulty in starting urination, inability to urinate, frequent urination, or getting up at night to urinate.  HoLEP was developed in the 1990's as a more effective and less expensive surgical option for BPH, compared to other surgical options such as laser vaporization(PVP/greenlight laser), transurethral resection of the prostate(TURP), and open simple prostatectomy.   What happens during a HoLEP?  HoLEP requires general anesthesia ("asleep" throughout the procedure).   An antibiotic is given to reduce the risk of infection  A surgical instrument called a resectoscope is inserted through the urethra (the tube that carries urine from the bladder). The resectoscope has a camera that allows the surgeon to view the internal structure of the prostate gland, and to see where the incisions are being made during surgery.  The laser is inserted into the resectoscope and is used to enucleate (free up) the enlarged prostate tissue from the capsule (outer shell) and then to seal up any blood vessels. The tissue that has been removed is pushed back into the bladder.  A morcellator is placed through the resectoscope, and is used to suction out the prostate tissue that has been pushed into the bladder.  When the prostate tissue has been removed, the resectoscope is removed, and a foley catheter is placed to allow healing and drain the urine from the  bladder.     What happens after a HoLEP?  More than 90% of patients go home the same day a few hours after surgery. Less than 10% will be admitted to the hospital overnight for observation to monitor the urine, or if they have other medical problems.  Fluid is flushed through the catheter for about 1 hour after surgery to clear any blood from the urine. It is normal to have some blood in the urine after surgery. The need for blood transfusion is extremely rare.  Eating and drinking are permitted after the procedure once the patient has fully awakened from anesthesia.  The catheter is usually removed 2-3 days after surgery- the patient will come to clinic to have the catheter removed and make sure they can urinate on their own.  It is very important to drink lots of fluids after surgery for one week to keep the bladder flushed.  At first, there may be some burning with urination, but this typically improved within a few hours to days. Most patients do not have a significant amount of pain, and narcotic pain medications are rarely needed.  Symptoms of urinary frequency, urgency, and even leakage are NORMAL for the first few weeks after surgery as the bladder adjusts after having to work hard against blockage from the prostate for many years. This will improve, but can sometimes take several months.  The use of pelvic floor exercises (Kegel exercises) can help improve problems with urinary incontinence.   After catheter removal, patients will be seen at 6 weeks and 6 months for symptom check  No heavy lifting for   at least 2-3 weeks after surgery, however patients can walk and do light activities the first day after surgery. Return to work time depends on occupation.    What are the advantages of HoLEP?  HoLEP has been studied in many different parts of the world and has been shown to be a safe and effective procedure. Although there are many types of BPH surgeries available, HoLEP offers a  unique advantage in being able to remove a large amount of tissue without any incisions on the body, even in very large prostates, while decreasing the risk of bleeding and providing tissue for pathology (to look for cancer). This decreases the need for blood transfusions during surgery, minimizes hospital stay, and reduces the risk of needing repeat treatment.  What are the side effects of HoLEP?  Temporary burning and bleeding during urination. Some blood may be seen in the urine for weeks after surgery and is part of the healing process.  Urinary incontinence (inability to control urine flow) is expected in all patients immediately after surgery and they should wear pads for the first few days/weeks. This typically improves over the course of several weeks. Performing Kegel exercises can help decrease leakage from stress maneuvers such as coughing, sneezing, or lifting. The rate of long term leakage is very low. Patients may also have leakage with urgency and this may be treated with medication. The risk of urge incontinence can be dependent on several factors including age, prostate size, symptoms, and other medical problems.  Retrograde ejaculation or "backwards ejaculation." In 75% of cases, the patient will not see any fluid during ejaculation after surgery.  Erectile function is generally not significantly affected.   What are the risks of HoLEP?  Injury to the urethra or development of scar tissue at a later date  Injury to the capsule of the prostate (typically treated with longer catheterization).  Injury to the bladder or ureteral orifices (where the urine from the kidney drains out)  Infection of the bladder, testes, or kidneys  Return of urinary obstruction at a later date requiring another operation (<2%)  Need for blood transfusion or re-operation due to bleeding  Failure to relieve all symptoms and/or need for prolonged catheterization after surgery  5-15% of patients are  found to have previously undiagnosed prostate cancer in their specimen. Prostate cancer can be treated after HoLEP.  Standard risks of anesthesia including blood clots, heart attacks, etc  When should I call my doctor?  Fever over 101.3 degrees  Inability to urinate, or large blood clots in the urine     Indwelling Urinary Catheter Care, Adult An indwelling urinary catheter is a thin tube that is put into your bladder. The tube helps to drain pee (urine) out of your body. The tube goes in through your urethra. Your urethra is where pee comes out of your body. Your pee will come out through the catheter, then it will go into a bag (drainage bag). Take good care of your catheter so it will work well. How to wear your catheter and bag Supplies needed  Sticky tape (adhesive tape) or a leg strap.  Alcohol wipe or soap and water (if you use tape).  A clean towel (if you use tape).  Large overnight bag.  Smaller bag (leg bag). Wearing your catheter Attach your catheter to your leg with tape or a leg strap.  Make sure the catheter is not pulled tight.  If a leg strap gets wet, take it off and put on  a dry strap.  If you use tape to hold the bag on your leg: 1. Use an alcohol wipe or soap and water to wash your skin where the tape made it sticky before. 2. Use a clean towel to pat-dry that skin. 3. Use new tape to make the bag stay on your leg. Wearing your bags You should have been given a large overnight bag.  You may wear the overnight bag in the day or night.  Always have the overnight bag lower than your bladder.  Do not let the bag touch the floor.  Before you go to sleep, put a clean plastic bag in a wastebasket. Then hang the overnight bag inside the wastebasket. You should also have a smaller leg bag that fits under your clothes.  Always wear the leg bag below your knee.  Do not wear your leg bag at night. How to care for your skin and catheter Supplies needed  A  clean washcloth.  Water and mild soap.  A clean towel. Caring for your skin and catheter  Clean the skin around your catheter every day: 1. Wash your hands with soap and water. 2. Wet a clean washcloth in warm water and mild soap. 3. Clean the skin around your urethra.  If you are male:  Gently spread the folds of skin around your vagina (labia).  With the washcloth in your other hand, wipe the inner side of your labia on each side. Wipe from front to back.  If you are male:  Pull back any skin that covers the end of your penis (foreskin).  With the washcloth in your other hand, wipe your penis in small circles. Start wiping at the tip of your penis, then move away from the catheter.  Move the foreskin back in place, if needed. 4. With your free hand, hold the catheter close to where it goes into your body.  Keep holding the catheter during cleaning so it does not get pulled out. 5. With the washcloth in your other hand, clean the catheter.  Only wipe downward on the catheter.  Do not wipe upward toward your body. Doing this may push germs into your urethra and cause infection. 6. Use a clean towel to pat-dry the catheter and the skin around it. Make sure to wipe off all soap. 7. Wash your hands with soap and water.  Shower every day. Do not take baths.  Do not use cream, ointment, or lotion on the area where the catheter goes into your body, unless your doctor tells you to.  Do not use powders, sprays, or lotions on your genital area.  Check your skin around the catheter every day for signs of infection. Check for: ? Redness, swelling, or pain. ? Fluid or blood. ? Warmth. ? Pus or a bad smell.      How to empty the bag Supplies needed  Rubbing alcohol.  Gauze pad or cotton ball.  Tape or a leg strap. Emptying the bag Pour the pee out of your bag when it is ?- full, or at least 2-3 times a day. Do this for your overnight bag and your leg bag. 1. Wash your  hands with soap and water. 2. Separate (detach) the bag from your leg. 3. Hold the bag over the toilet or a clean pail. Keep the bag lower than your hips and bladder. This is so the pee (urine) does not go back into the tube. 4. Open the pour spout. It is at  the bottom of the bag. 5. Empty the pee into the toilet or pail. Do not let the pour spout touch any surface. 6. Put rubbing alcohol on a gauze pad or cotton ball. 7. Use the gauze pad or cotton ball to clean the pour spout. 8. Close the pour spout. 9. Attach the bag to your leg with tape or a leg strap. 10. Wash your hands with soap and water. Follow instructions for cleaning the drainage bag:  From the product maker.  As told by your doctor. How to change the bag Supplies needed  Alcohol wipes.  A clean bag.  Tape or a leg strap. Changing the bag Replace your bag when it starts to leak, smell bad, or look dirty. 1. Wash your hands with soap and water. 2. Separate the dirty bag from your leg. 3. Pinch the catheter with your fingers so that pee does not spill out. 4. Separate the catheter tube from the bag tube where these tubes connect (at the connection valve). Do not let the tubes touch any surface. 5. Clean the end of the catheter tube with an alcohol wipe. Use a different alcohol wipe to clean the end of the bag tube. 6. Connect the catheter tube to the tube of the clean bag. 7. Attach the clean bag to your leg with tape or a leg strap. Do not make the bag tight on your leg. 8. Wash your hands with soap and water. General rules  Never pull on your catheter. Never try to take it out. Doing that can hurt you.  Always wash your hands before and after you touch your catheter or bag. Use a mild, fragrance-free soap. If you do not have soap and water, use hand sanitizer.  Always make sure there are no twists or bends (kinks) in the catheter tube.  Always make sure there are no leaks in the catheter or bag.  Drink enough  fluid to keep your pee pale yellow.  Do not take baths, swim, or use a hot tub.  If you are male, wipe from front to back after you poop (have a bowel movement).   Contact a doctor if:  Your pee is cloudy.  Your pee smells worse than usual.  Your catheter gets clogged.  Your catheter leaks.  Your bladder feels full. Get help right away if:  You have redness, swelling, or pain where the catheter goes into your body.  You have fluid, blood, pus, or a bad smell coming from the area where the catheter goes into your body.  Your skin feels warm where the catheter goes into your body.  You have a fever.  You have pain in your: ? Belly (abdomen). ? Legs. ? Lower back. ? Bladder.  You see blood in the catheter.  Your pee is pink or red.  You feel sick to your stomach (nauseous).  You throw up (vomit).  You have chills.  Your pee is not draining into the bag.  Your catheter gets pulled out. Summary  An indwelling urinary catheter is a thin tube that is placed into the bladder to help drain pee (urine) out of the body.  The catheter is placed into the part of the body that drains pee from the bladder (urethra).  Taking good care of your catheter will keep it working properly and help prevent problems.  Always wash your hands before and after touching your catheter or bag.  Never pull on your catheter or try to take it  out. This information is not intended to replace advice given to you by your health care provider. Make sure you discuss any questions you have with your health care provider. Document Revised: 02/09/2019 Document Reviewed: 06/03/2017 Elsevier Patient Education  2021 [employer]   1) The drugs that you were given will stay in your system until tomorrow so for the next 24 hours you should not:  A) Drive an automobile B) Make any legal decisions C) Drink any alcoholic beverage   2) You may  resume regular meals tomorrow.  Today it is better to start with liquids and gradually work up to solid foods.  You may eat anything you prefer, but it is better to start with liquids, then soup and crackers, and gradually work up to solid foods.   3) Please notify your doctor immediately if you have any unusual bleeding, trouble breathing, redness and pain at the surgery site, drainage, fever, or pain not relieved by medication.    4) Additional Instructions:        Please contact your physician with any problems or Same Day Surgery at [phone], Monday through Friday 6 am to 4 pm, or [place] at [facility] number at [phone].

## 2021-03-16 NOTE — Transfer of Care (Signed)
Immediate Anesthesia Transfer of Care Note  Patient: Michael Edwards  Procedure(s) Performed: HOLEP-LASER ENUCLEATION OF THE PROSTATE WITH MORCELLATION (N/A )  Patient Location: PACU  Anesthesia Type:General  Level of Consciousness: awake  Airway & Oxygen Therapy: Patient Spontanous Breathing and Patient connected to face mask oxygen  Post-op Assessment: Report given to RN and Post -op Vital signs reviewed and stable  Post vital signs: Reviewed and stable  Last Vitals:  Vitals Value Taken Time  BP 105/59 03/16/21 1517  Temp    Pulse 75 03/16/21 1519  Resp 17 03/16/21 1519  SpO2 100 % 03/16/21 1519  Vitals shown include unvalidated device data.  Last Pain:  Vitals:   03/16/21 1130  TempSrc: Temporal  PainSc: 0-No pain         Complications: No complications documented.

## 2021-03-16 NOTE — Anesthesia Procedure Notes (Signed)
Procedure Name: Intubation Date/Time: 03/16/2021 12:57 PM Performed by: Lily Peer, Josedaniel Haye, CRNA Pre-anesthesia Checklist: Patient identified, Emergency Drugs available, Suction available and Patient being monitored Patient Re-evaluated:Patient Re-evaluated prior to induction Oxygen Delivery Method: Circle system utilized Preoxygenation: Pre-oxygenation with 100% oxygen Induction Type: IV induction Ventilation: Mask ventilation without difficulty Laryngoscope Size: McGraph and 4 Grade View: Grade I Tube type: Oral Tube size: 7.5 mm Number of attempts: 1 Airway Equipment and Method: Stylet and Oral airway Placement Confirmation: ETT inserted through vocal cords under direct vision,  positive ETCO2 and breath sounds checked- equal and bilateral Secured at: 21 cm Tube secured with: Tape Dental Injury: Teeth and Oropharynx as per pre-operative assessment

## 2021-03-17 ENCOUNTER — Telehealth: Payer: Self-pay | Admitting: *Deleted

## 2021-03-17 ENCOUNTER — Encounter: Payer: Self-pay | Admitting: Urology

## 2021-03-17 MED ORDER — CIPROFLOXACIN HCL 500 MG PO TABS: 500.0000 mg | ORAL_TABLET | Freq: Two times a day (BID) | ORAL | 0 refills | Status: DC

## 2021-03-17 NOTE — Telephone Encounter (Addendum)
Patient informed, RX sent in to pharmacy  ----- Message from Hollice Espy, MD sent at 03/16/2021  9:19 PM EDT ----- Please change abx to cipro 500 mg bid x 7 days based on culture results.  Hollice Espy, MD

## 2021-03-18 ENCOUNTER — Ambulatory Visit: Payer: Self-pay | Admitting: Physician Assistant

## 2021-03-18 ENCOUNTER — Ambulatory Visit (INDEPENDENT_AMBULATORY_CARE_PROVIDER_SITE_OTHER): Payer: Medicare Other | Admitting: Physician Assistant

## 2021-03-18 ENCOUNTER — Other Ambulatory Visit: Payer: Self-pay

## 2021-03-18 DIAGNOSIS — N401 Enlarged prostate with lower urinary tract symptoms: Secondary | ICD-10-CM

## 2021-03-18 DIAGNOSIS — N138 Other obstructive and reflux uropathy: Secondary | ICD-10-CM

## 2021-03-18 NOTE — Progress Notes (Signed)
Appointment made in error, no charge for today. Patient to RTC on POD7 for voiding trial.

## 2021-03-18 NOTE — Anesthesia Postprocedure Evaluation (Signed)
Anesthesia Post Note  Patient: Michael Edwards  Procedure(s) Performed: HOLEP-LASER ENUCLEATION OF THE PROSTATE WITH MORCELLATION (N/A )  Patient location during evaluation: PACU Anesthesia Type: General Level of consciousness: awake and alert Pain management: pain level controlled Vital Signs Assessment: post-procedure vital signs reviewed and stable Respiratory status: spontaneous breathing, nonlabored ventilation, respiratory function stable and patient connected to nasal cannula oxygen Cardiovascular status: blood pressure returned to baseline and stable Postop Assessment: no apparent nausea or vomiting Anesthetic complications: no   No complications documented.   Last Vitals:  Vitals:   03/16/21 1606 03/16/21 1627  BP:  (!) 115/50  Pulse: 81 88  Resp: 15 16  Temp: (!) 36.3 C (!) 36.4 C  SpO2: 93% 95%    Last Pain:  Vitals:   03/17/21 0903  TempSrc:   PainSc: 2                  Martha Clan

## 2021-03-20 LAB — SURGICAL PATHOLOGY

## 2021-03-23 ENCOUNTER — Ambulatory Visit (INDEPENDENT_AMBULATORY_CARE_PROVIDER_SITE_OTHER): Payer: Medicare Other | Admitting: Physician Assistant

## 2021-03-23 ENCOUNTER — Ambulatory Visit: Payer: Self-pay | Admitting: Physician Assistant

## 2021-03-23 ENCOUNTER — Other Ambulatory Visit: Payer: Self-pay

## 2021-03-23 ENCOUNTER — Encounter: Payer: Self-pay | Admitting: Physician Assistant

## 2021-03-23 DIAGNOSIS — N138 Other obstructive and reflux uropathy: Secondary | ICD-10-CM

## 2021-03-23 DIAGNOSIS — N401 Enlarged prostate with lower urinary tract symptoms: Secondary | ICD-10-CM

## 2021-03-23 LAB — BLADDER SCAN AMB NON-IMAGING

## 2021-03-23 NOTE — Progress Notes (Signed)
Catheter Removal  Patient is present today for a catheter removal.  99ml of water was drained from the balloon. A 22FR foley cath was removed from the bladder no complications were noted. Patient tolerated well.  Performed by: Debroah Loop, PA-C   Follow up/ Additional notes: Push fluids and RTC this afternoon for PVR.

## 2021-03-23 NOTE — Progress Notes (Signed)
Afternoon follow-up  Patient returned to clinic this afternoon for repeat PVR. He reports drinking approximately 52oz of fluid. He has been able to urinate. He has had urinary leakage. PVR 7mL.  Results for orders placed or performed in visit on 03/23/21  BLADDER SCAN AMB NON-IMAGING  Result Value Ref Range   Scan Result 50mL     Voiding trial passed. Counseled patient on normal postoperative findings including dysuria, gross hematuria, and urinary urgency/urge incontinence. Counseled him to start Kegel exercises 3x10 sets daily to increase urinary control and resume normal fluid intake.   Shared pathology results; explained that this finding represents a tiny focus of low risk cancer and that active surveillance is typically recommended in these cases. Patient is in agreement with this plan.  Also provided return to work note for 5/30, counseled patient to refrain from lifting objects heavier than 20lbs for 2 weeks after surgery.  Follow up: 6 week postop with Dr. Erlene Quan

## 2021-03-23 NOTE — Patient Instructions (Addendum)
Stop Bactrim and Flomax.  Take oxybutynin and Norco as needed for bladder spasms or pain, respectively.  Finish Cipro.  Congratulations on your recent HOLEP procedure! As discussed in clinic today, there are three main side effects that commonly occur after surgery: 1. Burning or pain with urination: This typically resolves within 1 week of surgery. If you are still having significant pain with urination 10 days after surgery, please call our clinic. We may need to check you for a urinary tract infection at that point, though this is rare. 2. Blood in the urine: This may come and go, but typically resolves completely within 3 weeks of surgery. If you are on blood thinners, it may take longer for the bleeding to resolve. As long as your urine remains thin and runny and you are not passing large clots (around the size of your palm), this is a normal postoperative finding. If you start to pass dark red urine or thick, ketchup-like urine, please call our office immediately. 3. Urinary leakage or urgency: This tends to improve with time, with most patients becoming dry within around 3 months of surgery. You may wear absorbant underwear or liners for security during this time. To help you get dry faster, please make sure you are completing your Kegel exercises as instructed, with a set of 10 exercises completed up to three times daily.   Kegel Exercises  Kegel exercises can help strengthen your pelvic floor muscles. The pelvic floor is a group of muscles that support your rectum, small intestine, and bladder. In females, pelvic floor muscles also help support the womb (uterus). These muscles help you control the flow of urine and stool. Kegel exercises are painless and simple, and they do not require any equipment. Your provider may suggest Kegel exercises to:  Improve bladder and bowel control.  Improve sexual response.  Improve weak pelvic floor muscles after surgery to remove the uterus  (hysterectomy) or pregnancy (females).  Improve weak pelvic floor muscles after prostate gland removal or surgery (males). Kegel exercises involve squeezing your pelvic floor muscles, which are the same muscles you squeeze when you try to stop the flow of urine or keep from passing gas. The exercises can be done while sitting, standing, or lying down, but it is best to vary your position. Exercises How to do Kegel exercises: 1. Squeeze your pelvic floor muscles tight. You should feel a tight lift in your rectal area. If you are a male, you should also feel a tightness in your vaginal area. Keep your stomach, buttocks, and legs relaxed. 2. Hold the muscles tight for up to 10 seconds. 3. Breathe normally. 4. Relax your muscles. 5. Repeat as told by your health care provider. Repeat this exercise daily as told by your health care provider. Continue to do this exercise for at least 4-6 weeks, or for as long as told by your health care provider. You may be referred to a physical therapist who can help you learn more about how to do Kegel exercises. Depending on your condition, your health care provider may recommend:  Varying how long you squeeze your muscles.  Doing several sets of exercises every day.  Doing exercises for several weeks.  Making Kegel exercises a part of your regular exercise routine. This information is not intended to replace advice given to you by your health care provider. Make sure you discuss any questions you have with your health care provider. Document Revised: 02/22/2020 Document Reviewed: 06/07/2018 Elsevier Patient Education  2021 [employer] 

## 2021-03-24 ENCOUNTER — Ambulatory Visit: Payer: Self-pay | Admitting: Physician Assistant

## 2021-04-01 DIAGNOSIS — Z952 Presence of prosthetic heart valve: Secondary | ICD-10-CM | POA: Diagnosis not present

## 2021-04-01 DIAGNOSIS — R011 Cardiac murmur, unspecified: Secondary | ICD-10-CM | POA: Diagnosis not present

## 2021-05-05 ENCOUNTER — Ambulatory Visit (INDEPENDENT_AMBULATORY_CARE_PROVIDER_SITE_OTHER): Payer: Medicare Other | Admitting: Urology

## 2021-05-05 ENCOUNTER — Encounter: Payer: Self-pay | Admitting: Urology

## 2021-05-05 ENCOUNTER — Other Ambulatory Visit: Payer: Self-pay

## 2021-05-05 VITALS — BP 118/58 | HR 60 | Ht 65.0 in | Wt 125.0 lb

## 2021-05-05 DIAGNOSIS — C61 Malignant neoplasm of prostate: Secondary | ICD-10-CM

## 2021-05-05 DIAGNOSIS — N401 Enlarged prostate with lower urinary tract symptoms: Secondary | ICD-10-CM | POA: Diagnosis not present

## 2021-05-05 DIAGNOSIS — N393 Stress incontinence (female) (male): Secondary | ICD-10-CM

## 2021-05-05 DIAGNOSIS — N138 Other obstructive and reflux uropathy: Secondary | ICD-10-CM

## 2021-05-05 LAB — BLADDER SCAN AMB NON-IMAGING: Scan Result: 182

## 2021-05-05 NOTE — Progress Notes (Signed)
05/05/2021 10:48 AM   Michael Edwards Sep 28, 1946 [id number]  Referring provider: Perrin Maltese, MD [place],  [place] [address]  Chief Complaint  Patient presents with   Benign Prostatic Hypertrophy    HPI: 75 year old male with chronic BPH with outlet obstruction/chronic urinary retention previously managed by [facility] who returns today for 6-week follow-up status post holep.  Today, he reports that his flow has improved dramatically.  He feels like he is emptying much better than previous.  He is no longer self cathing.  He also reports that he is able to hold his bladder for period of time especially when sitting which was previously not possible.  He is pretty pleased with this.  He does report that he is leaking a little still up especially when he stands up he dribbles a little bit.  He is not really doing the pelvic floor exercises discussed.  No dysuria or gross hematuria.  Surgical pathology consistent with primarily BPH but there was less than 1% of incidental acinar adenocarcinoma, Gleason 3+3 within the surgical specimen.  50 g resection.  Preoperative PSA 9.9 in the setting of chronic retention.  He is currently on tadalafil only.   IPSS     Row Name 05/05/21 1000         International Prostate Symptom Score   How often have you had the sensation of not emptying your bladder? Less than 1 in 5     How often have you had to urinate less than every two hours? Not at All     How often have you found you stopped and started again several times when you urinated? Not at All     How often have you found it difficult to postpone urination? About half the time     How often have you had a weak urinary stream? Not at All     How often have you had to strain to start urination? Not at All     How many times did you typically get up at night to urinate? 2 Times     Total IPSS Score 6           Quality of Life due to urinary symptoms     If you were to spend  the rest of your life with your urinary condition just the way it is now how would you feel about that? Pleased             Score:  1-7 Mild 8-19 Moderate 20-35 Severe    PMH: Past Medical History:  Diagnosis Date   CAD (coronary artery disease)    Cardiac murmur    ED (erectile dysfunction)    Elevated PSA    10.6 on 07/2015   Enlarged prostate    HLD (hyperlipidemia)    Incomplete bladder emptying 07/27/2016   S/P TAVR (transcatheter aortic valve replacement) 01/30/2019   23 mm Edwards Sapien 3 transcatheter heart valve placed via percutaneous right transfemoral approach    Self-catheterizes urinary bladder    Severe aortic stenosis 08/19/2015   Squamous cell carcinoma of skin 08/04/2020   Right thumb. SCCis, hypertrophic. shave removal and EDC 10/21/20   STEMI (ST elevation myocardial infarction) ([place])    "many years ago" in [place]; no LHC for PCI; treated medically per patient report   Tobacco abuse     Surgical History: Past Surgical History:  Procedure Laterality Date   ANKLE FRACTURE SURGERY Left    blood clot removed  from left upper leg     HOLEP-LASER ENUCLEATION OF THE PROSTATE WITH MORCELLATION N/A 03/16/2021   Procedure: HOLEP-LASER ENUCLEATION OF THE PROSTATE WITH MORCELLATION;  Surgeon: Hollice Espy, MD;  Location: [facility];  Service: Urology;  Laterality: N/A;   INGUINAL HERNIA REPAIR Left 02/03/2017   Procedure: HERNIA REPAIR INGUINAL ADULT;  Surgeon: Clayburn Pert, MD;  Location: [facility];  Service: General;  Laterality: Left;   RIGHT HEART CATH AND CORONARY ANGIOGRAPHY N/A 01/12/2019   Procedure: RIGHT HEART CATH AND CORONARY ANGIOGRAPHY;  Surgeon: Wellington Hampshire, MD;  Location: [place] [facility];  Service: Cardiovascular;  Laterality: N/A;   TEE WITHOUT CARDIOVERSION N/A 01/30/2019   Procedure: TRANSESOPHAGEAL ECHOCARDIOGRAM (TEE);  Surgeon: Burnell Blanks, MD;  Location: [place] [facility];  Service: Open Heart Surgery;  Laterality:  N/A;   TONSILLECTOMY     TRANSCATHETER AORTIC VALVE REPLACEMENT, TRANSFEMORAL N/A 01/30/2019   Procedure: TRANSCATHETER AORTIC VALVE REPLACEMENT, TRANSFEMORAL;  Surgeon: Burnell Blanks, MD;  Location: [place] [facility];  Service: Open Heart Surgery;  Laterality: N/A;   VASECTOMY      Home Medications:  Allergies as of 05/05/2021   No Known Allergies      Medication List        Accurate as of May 05, 2021 10:48 AM. If you have any questions, ask your nurse or doctor.          STOP taking these medications    ciprofloxacin 500 MG tablet Commonly known as: CIPRO Stopped by: Hollice Espy, MD   HYDROcodone-acetaminophen 5-325 MG tablet Commonly known as: NORCO/VICODIN Stopped by: Hollice Espy, MD   mupirocin ointment 2 % Commonly known as: BACTROBAN Stopped by: Hollice Espy, MD   oxybutynin 5 MG tablet Commonly known as: DITROPAN Stopped by: Hollice Espy, MD       TAKE these medications    aspirin EC 81 MG tablet Take 81 mg by mouth daily.   multivitamin tablet Take 1 tablet by mouth daily.   tadalafil 20 MG tablet Commonly known as: CIALIS Take 1 tablet (20 mg total) by mouth daily as needed for erectile dysfunction.        Allergies: No Known Allergies  Family History: Family History  Problem Relation Age of Onset   Hematuria Mother    Peripheral Artery Disease Father    Diabetes Sister    Heart failure Sister     Social History:  reports that he has been smoking cigarettes. He has a 58.00 pack-year smoking history. He has never used smokeless tobacco. He reports current alcohol use. He reports that he does not use drugs.   Physical Exam: BP (!) 118/58   Pulse 60   Ht 5\' 5"  (1.651 m)   Wt 125 lb (56.7 kg)   BMI 20.80 kg/m   Constitutional:  Alert and oriented, No acute distress. HEENT: [place] AT, moist mucus membranes.  Trachea midline, no masses. Cardiovascular: No clubbing, cyanosis, or edema. Respiratory: Normal  respiratory effort, no increased work of breathing. Skin: No rashes, bruises or suspicious lesions. Neurologic: Grossly intact, no focal deficits, moving all 4 extremities. Psychiatric: Normal mood and affect.  Results for orders placed or performed in visit on 05/05/21  Bladder Scan (Post Void Residual) in office  Result Value Ref Range   Scan Result 182     Assessment & Plan:    1. Benign prostatic hyperplasia with urinary obstruction Status post successful HoLEP now no longer self cathing with dramatic improvement in urinary  symptoms, especially irritative symptoms including urgency frequency as well as stream - Bladder Scan (Post Void Residual) in office - PSA; Future  2. SUI (stress urinary incontinence), male Mild stress incontinence, discussed the importance of pelvic floor exercises, should resolve with time  3. Prostate cancer [facility]) We do lengthy discussion today about incidental finding of prostate cancer and surgical specimen  We discussed the natural history of prostate cancer including the treatment options for various grade group/restratification groups of prostate cancer.  He falls into the very low risk prostate cancer group.  As such, I would mow strongly recommend active surveillance.  He is agreeable with this plan, we will have him return in 6 months for IPSS/PVR/PSA to monitor.  Anticipate drop in his PSA status post holep.  If his PSA fails to trend downwards, would recommend prostate MRI for further evaluation.    Hollice Espy, MD  [facility] [address] [place], [place] [address] [phone]

## 2021-07-09 IMAGING — CT CT ABD-PEL WO/W CM
3 of 12 series · 12 of 46 positions shown, 18 images · IV contrast (omnipaque)
Comparison: 01/09/2019

CLINICAL DATA: Gross hematuria

EXAM:
CT ABDOMEN AND PELVIS WITHOUT AND WITH CONTRAST
TECHNIQUE: Multidetector CT imaging of the abdomen and pelvis was performed
following the standard protocol before and following the bolus
administration of intravenous contrast.
CONTRAST:  125mL OMNIPAQUE IOHEXOL 300 MG/ML  SOLN

[Series 2: axial pre · axial · non-contrast · 0.62mm/px · z∈[-409,-289]mm · 3 of 83 slices shown]
[im 12/83  soft-tissue]
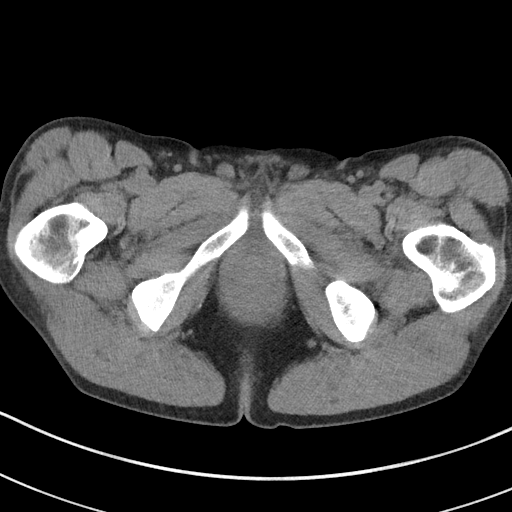
[im 24/83  soft-tissue]
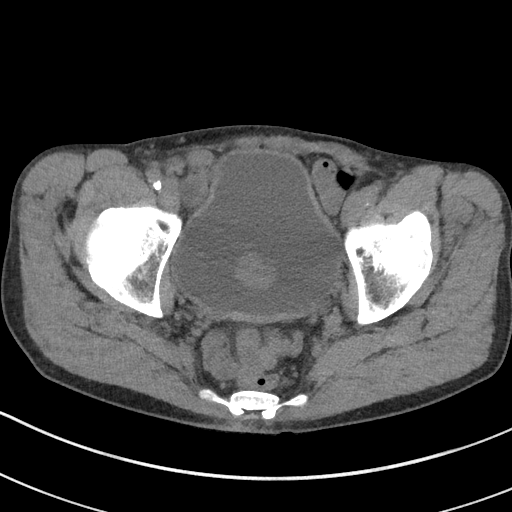
[im 36/83  soft-tissue]
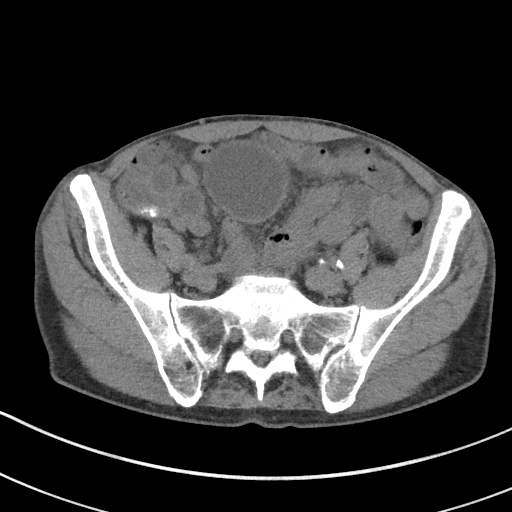

[Series 13: axial delay · axial · delayed · 0.67mm/px · z∈[-518,-178]mm · 7 of 92 slices shown, 12 images]
[im 12/92  soft-tissue]
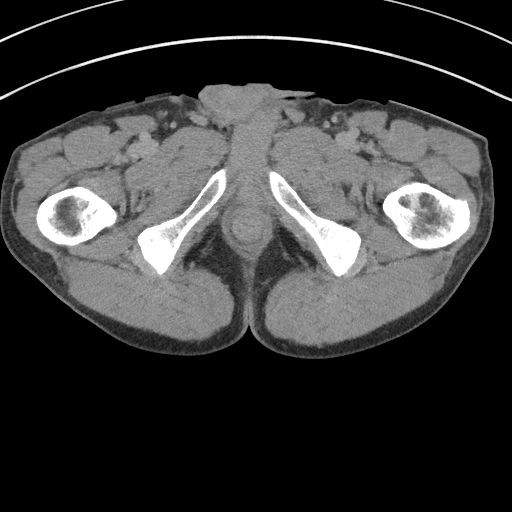
[im 12/92  bone]
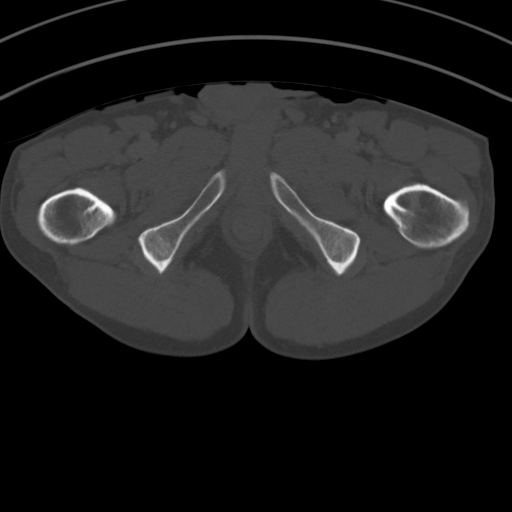
[im 23/92  soft-tissue]
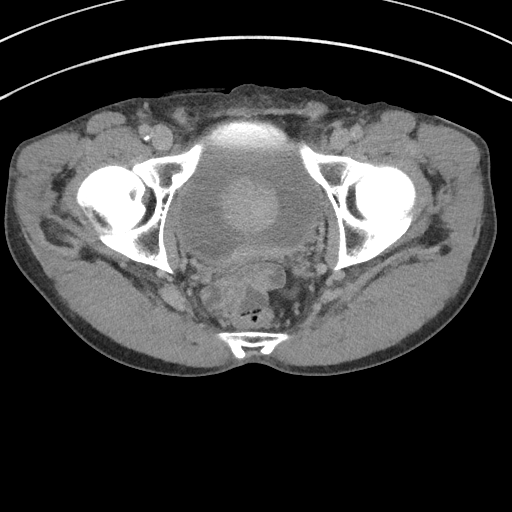
[im 35/92  soft-tissue]
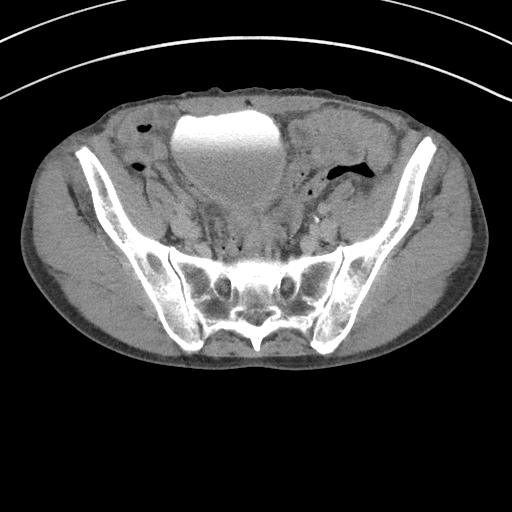
[im 46/92  soft-tissue]
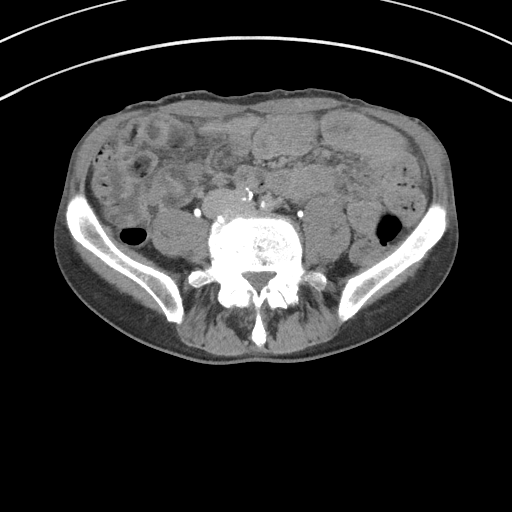
[im 46/92  lung]
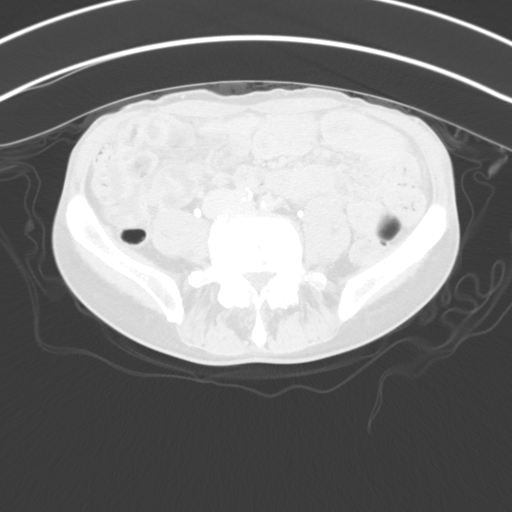
[im 57/92  soft-tissue]
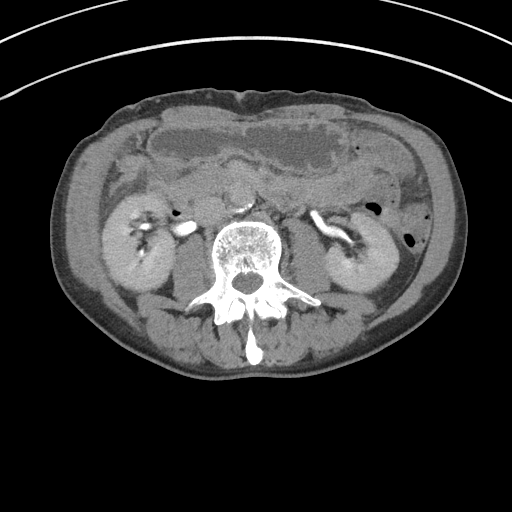
[im 57/92  lung]
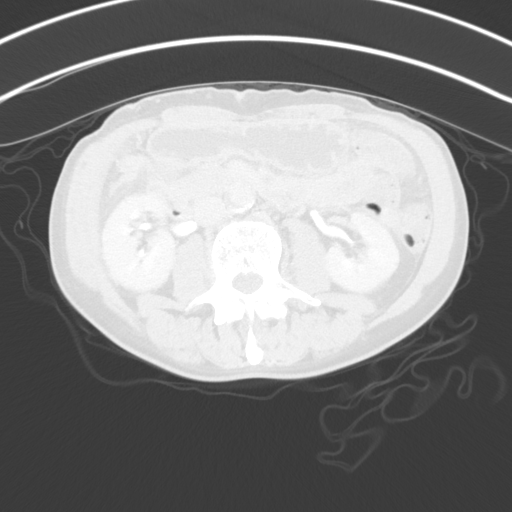
[im 69/92  soft-tissue]
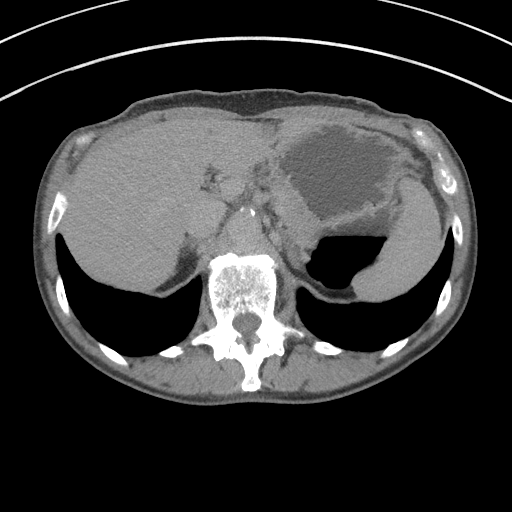
[im 69/92  lung]
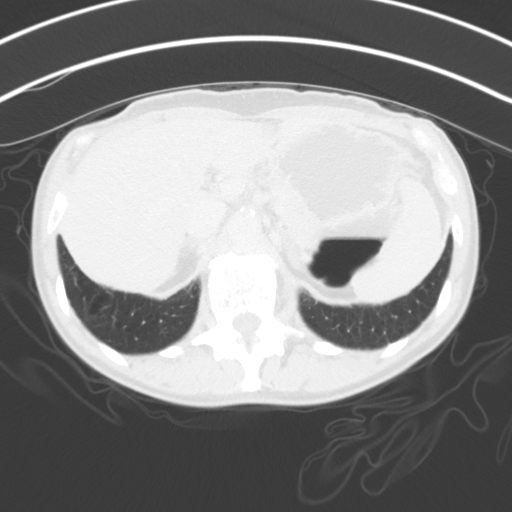
[im 80/92  soft-tissue]
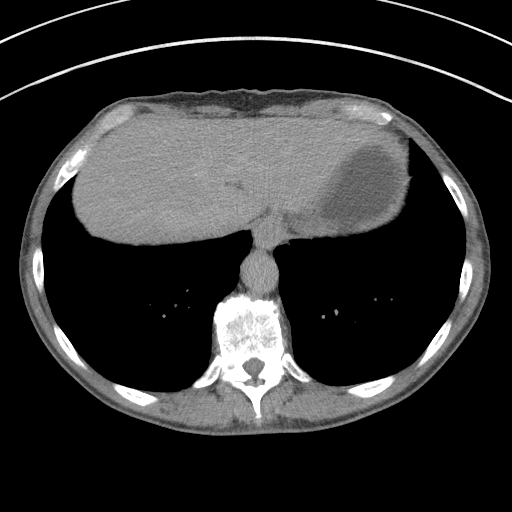
[im 80/92  lung]
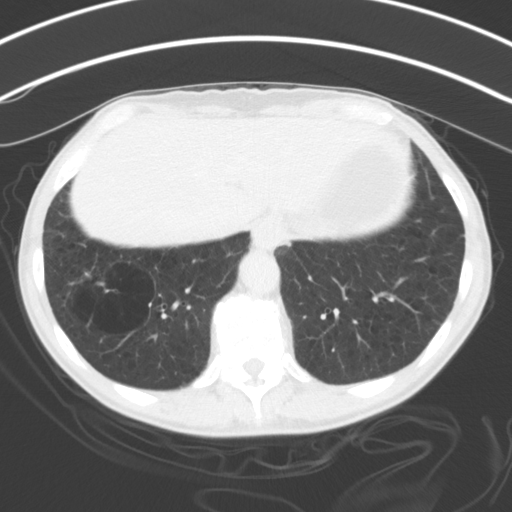

[Series 16: coronal delay · coronal · delayed · 0.74mm/px · 2 of 92 slices shown, 3 images]
[im 31/92  soft-tissue]
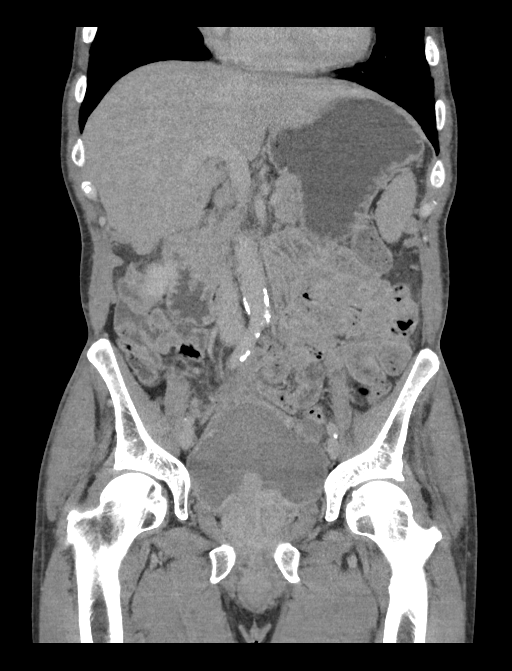
[im 31/92  bone]
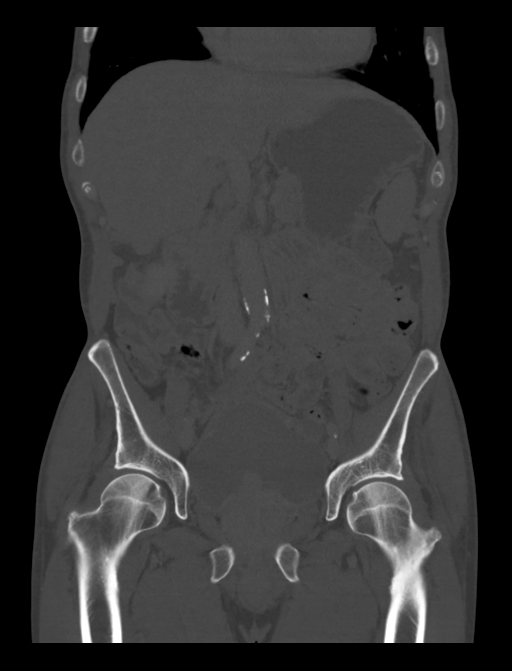
[im 61/92  soft-tissue]
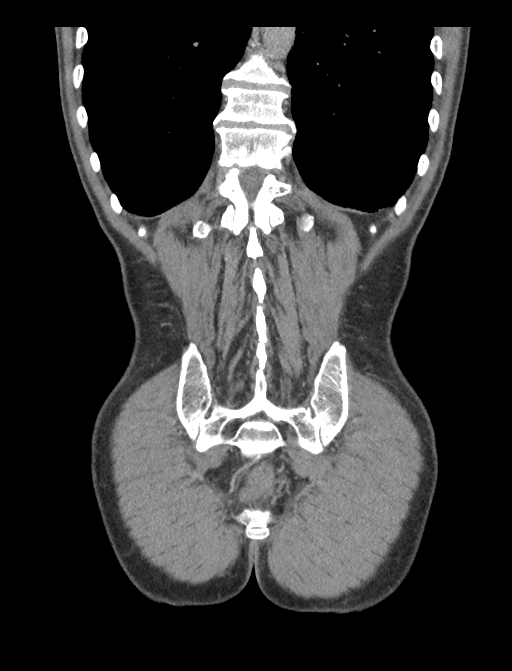

[12 of 46 positions shown; findings below may reference images not displayed]

FINDINGS: Lower chest: No acute abnormality.  Emphysema.

Hepatobiliary: No solid liver abnormality is seen. Multiple
low-attenuation cysts or hemangiomata in the left lobe of the liver.
No gallstones, gallbladder wall thickening, or biliary dilatation.

Pancreas: Unremarkable. No pancreatic ductal dilatation or
surrounding inflammatory changes.

Spleen: Normal in size without significant abnormality.

Adrenals/Urinary Tract: Adrenal glands are unremarkable. Kidneys are
normal, without renal calculi, solid lesion, or hydronephrosis. No
urinary tract filling defect on delayed phase imaging. Thickening of
the urinary bladder wall.

Stomach/Bowel: Stomach is within normal limits. Appendix appears
normal. No evidence of bowel wall thickening, distention, or
inflammatory changes.

Vascular/Lymphatic: Aortic atherosclerosis. No enlarged abdominal or
pelvic lymph nodes.

Reproductive: Severe prostatomegaly with median lobe hypertrophy.

Other: No abdominal wall hernia or abnormality. No abdominopelvic
ascites.

Musculoskeletal: No acute or significant osseous findings.
IMPRESSION: 1. No CT findings to explain hematuria. No evidence of urinary tract
calculus, suspicious renal lesion, or hydronephrosis. No urinary
tract filling defect on delayed phase imaging.
2. Severe prostatomegaly with median lobe hypertrophy. Thickening of
the urinary bladder wall, likely due to chronic outlet obstruction.

Aortic Atherosclerosis ([id number]).

## 2021-08-13 ENCOUNTER — Ambulatory Visit: Payer: Self-pay | Admitting: Urology

## 2021-11-11 ENCOUNTER — Ambulatory Visit: Payer: Self-pay | Admitting: Urology

## 2021-11-20 ENCOUNTER — Other Ambulatory Visit: Payer: Self-pay

## 2021-11-20 ENCOUNTER — Other Ambulatory Visit: Payer: Medicare Other

## 2021-11-20 DIAGNOSIS — N138 Other obstructive and reflux uropathy: Secondary | ICD-10-CM | POA: Diagnosis not present

## 2021-11-20 DIAGNOSIS — N401 Enlarged prostate with lower urinary tract symptoms: Secondary | ICD-10-CM | POA: Diagnosis not present

## 2021-11-21 LAB — PSA: Prostate Specific Ag, Serum: 0.8 ng/mL (ref 0.0–4.0)

## 2021-11-23 NOTE — Progress Notes (Signed)
11/24/21 8:56 AM   Michael Edwards 1945/11/09 [id number]  Referring provider:  Perrin Maltese, MD [place],  [place] [address] Chief Complaint  Patient presents with   Benign Prostatic Hypertrophy     HPI: Michael Edwards is a 76 y.o.male with a personal history of chronic BPH with outlet obstruction/chronic urinary retention previously managed by [facility] , who presents today for 6 month follow-up with IPSS, PVR, and PSA.   He is s/p HoLEP on 03/16/2021.  Surgical pathology consistent with primarily BPH but there was less than 1% of incidental acinar adenocarcinoma, Gleason 3+3 within the surgical specimen.  50 g resection.  Preoperative PSA 9.9 in the setting of chronic retention.  His most recent PSA on 11/20/2021 was 0.8.   He reports that he experiences leakage primarily when he is active and how much liquid intake he has. He wears depends. He is not bothered by this and has seen significant improvement. He has tried to remember to continue his pelvic floor exercises but he finds it hard considering he works 3 days a week and takes care of his autistic son.   He is interested in donating unused catheters today.   PSA trend:  Component Prostate Specific Ag, Serum  Latest Ref Rng & Units 0.0 - 4.0 ng/mL  01/04/2019 9.1 (H)  01/15/2020 9.5 (H)  01/08/2021 9.9 (H)  11/20/2021 0.8     IPSS     Row Name 11/24/21 0800         International Prostate Symptom Score   How often have you had the sensation of not emptying your bladder? Not at All     How often have you had to urinate less than every two hours? Not at All     How often have you found you stopped and started again several times when you urinated? Not at All     How often have you found it difficult to postpone urination? Not at All     How often have you had a weak urinary stream? Not at All     How often have you had to strain to start urination? Not at All     How many times did you typically get up at  night to urinate? 1 Time     Total IPSS Score 1       Quality of Life due to urinary symptoms   If you were to spend the rest of your life with your urinary condition just the way it is now how would you feel about that? Pleased              Score:  1-7 Mild 8-19 Moderate 20-35 Severe   PMH: Past Medical History:  Diagnosis Date   CAD (coronary artery disease)    Cardiac murmur    ED (erectile dysfunction)    Elevated PSA    10.6 on 07/2015   Enlarged prostate    HLD (hyperlipidemia)    Incomplete bladder emptying 07/27/2016   S/P TAVR (transcatheter aortic valve replacement) 01/30/2019   23 mm Edwards Sapien 3 transcatheter heart valve placed via percutaneous right transfemoral approach    Self-catheterizes urinary bladder    Severe aortic stenosis 08/19/2015   Squamous cell carcinoma of skin 08/04/2020   Right thumb. SCCis, hypertrophic. shave removal and EDC 10/21/20   STEMI (ST elevation myocardial infarction) ([place])    "many years ago" in [place]; no LHC for PCI; treated medically per patient report  Tobacco abuse     Surgical History: Past Surgical History:  Procedure Laterality Date   ANKLE FRACTURE SURGERY Left    blood clot removed from left upper leg     HOLEP-LASER ENUCLEATION OF THE PROSTATE WITH MORCELLATION N/A 03/16/2021   Procedure: HOLEP-LASER ENUCLEATION OF THE PROSTATE WITH MORCELLATION;  Surgeon: Hollice Espy, MD;  Location: [facility];  Service: Urology;  Laterality: N/A;   INGUINAL HERNIA REPAIR Left 02/03/2017   Procedure: HERNIA REPAIR INGUINAL ADULT;  Surgeon: Clayburn Pert, MD;  Location: [facility];  Service: General;  Laterality: Left;   RIGHT HEART CATH AND CORONARY ANGIOGRAPHY N/A 01/12/2019   Procedure: RIGHT HEART CATH AND CORONARY ANGIOGRAPHY;  Surgeon: Wellington Hampshire, MD;  Location: [place] [facility];  Service: Cardiovascular;  Laterality: N/A;   TEE WITHOUT CARDIOVERSION N/A 01/30/2019   Procedure: TRANSESOPHAGEAL ECHOCARDIOGRAM  (TEE);  Surgeon: Burnell Blanks, MD;  Location: [place] [facility];  Service: Open Heart Surgery;  Laterality: N/A;   TONSILLECTOMY     TRANSCATHETER AORTIC VALVE REPLACEMENT, TRANSFEMORAL N/A 01/30/2019   Procedure: TRANSCATHETER AORTIC VALVE REPLACEMENT, TRANSFEMORAL;  Surgeon: Burnell Blanks, MD;  Location: [facility] [facility];  Service: Open Heart Surgery;  Laterality: N/A;   VASECTOMY      Home Medications:  Allergies as of 11/24/2021   No Known Allergies      Medication List        Accurate as of November 24, 2021  8:56 AM. If you have any questions, ask your nurse or doctor.          STOP taking these medications    tadalafil 20 MG tablet Commonly known as: CIALIS Stopped by: Hollice Espy, MD       TAKE these medications    aspirin EC 81 MG tablet Take 81 mg by mouth daily.   multivitamin tablet Take 1 tablet by mouth daily.        Allergies: No Known Allergies  Family History: Family History  Problem Relation Age of Onset   Hematuria Mother    Peripheral Artery Disease Father    Diabetes Sister    Heart failure Sister     Social History:  reports that he has been smoking cigarettes. He has a 58.00 pack-year smoking history. He has never used smokeless tobacco. He reports current alcohol use. He reports that he does not use drugs.   Physical Exam: BP 137/77    Pulse 71    Ht 5\' 5"  (1.651 m)    Wt 123 lb (55.8 kg)    BMI 20.47 kg/m   Constitutional:  Alert and oriented, No acute distress. HEENT: [place] AT, moist mucus membranes.  Trachea midline, no masses. Cardiovascular: No clubbing, cyanosis, or edema. Respiratory: Normal respiratory effort, no increased work of breathing. Skin: No rashes, bruises or suspicious lesions. Neurologic: Grossly intact, no focal deficits, moving all 4 extremities. Psychiatric: Normal mood and affect.  Laboratory Data:  Lab Results  Component Value Date   CREATININE 1.00 01/29/2021   Lab Results   Component Value Date   PSA 10.6 07/14/2015   Lab Results  Component Value Date   HGBA1C 5.5 01/29/2019    Pertinent Imaging: Results for orders placed or performed in visit on 11/24/21  Bladder Scan (Post Void Residual) in office  Result Value Ref Range   Scan Result 9     Assessment & Plan:    BPH with urinary obstruction  - S/p HoLEP  - Dramatic improvement in urinary  symptoms not currently on any BPH medication  -Emptying well today - Bladder Scan (PVR) in office  - PSA; future   2. SUI, male  - Mild stress incontinence  - Symptoms are not bothersome  - Recommend he continue pelvic floor exercises, consider physical therapy referral but declined today  3. Prostate cancer  -Incidental low risk at the time of HoLEP -New baseline PSA for future comparison, we will continue active surveillance - Will continue to monitor PSA  Return in 6 months for PSA/DRE/IPSS/PVR  Gevena Mart Littlejohn,acting as a scribe for Hollice Espy, MD.,have documented all relevant documentation on the behalf of Hollice Espy, MD,as directed by  Hollice Espy, MD while in the presence of Hollice Espy, MD.  I have reviewed the above documentation for accuracy and completeness, and I agree with the above.   Hollice Espy, MD   [facility] [address], [place], [place] [address] [phone]

## 2021-11-24 ENCOUNTER — Ambulatory Visit (INDEPENDENT_AMBULATORY_CARE_PROVIDER_SITE_OTHER): Payer: Medicare Other | Admitting: Urology

## 2021-11-24 ENCOUNTER — Encounter: Payer: Self-pay | Admitting: Urology

## 2021-11-24 ENCOUNTER — Other Ambulatory Visit: Payer: Self-pay

## 2021-11-24 VITALS — BP 137/77 | HR 71 | Ht 65.0 in | Wt 123.0 lb

## 2021-11-24 DIAGNOSIS — N401 Enlarged prostate with lower urinary tract symptoms: Secondary | ICD-10-CM

## 2021-11-24 DIAGNOSIS — N138 Other obstructive and reflux uropathy: Secondary | ICD-10-CM

## 2021-11-24 LAB — BLADDER SCAN AMB NON-IMAGING: Scan Result: 9

## 2022-05-24 ENCOUNTER — Other Ambulatory Visit: Payer: Medicare Other

## 2022-05-24 ENCOUNTER — Telehealth: Payer: Self-pay | Admitting: Urology

## 2022-05-24 DIAGNOSIS — N138 Other obstructive and reflux uropathy: Secondary | ICD-10-CM | POA: Diagnosis not present

## 2022-05-24 DIAGNOSIS — N401 Enlarged prostate with lower urinary tract symptoms: Secondary | ICD-10-CM | POA: Diagnosis not present

## 2022-05-24 NOTE — Telephone Encounter (Signed)
Pt would like a refill of Cialis sent to [facility] on [facility].

## 2022-05-25 ENCOUNTER — Other Ambulatory Visit: Payer: Medicare Other

## 2022-05-25 ENCOUNTER — Telehealth: Payer: Self-pay | Admitting: *Deleted

## 2022-05-25 LAB — PSA: Prostate Specific Ag, Serum: 0.8 ng/mL (ref 0.0–4.0)

## 2022-05-25 MED ORDER — TADALAFIL 20 MG PO TABS: 20.0000 mg | ORAL_TABLET | Freq: Every day | ORAL | 3 refills | Status: DC | PRN

## 2022-05-25 NOTE — Telephone Encounter (Addendum)
Left patient Vm with details, asked to return call with any questions.  ----- Message from Hollice Espy, MD sent at 05/25/2022  9:56 AM EDT ----- PSA is stable, great news.    Hollice Espy, MD

## 2022-05-25 NOTE — Telephone Encounter (Signed)
Sent in refill

## 2022-11-22 ENCOUNTER — Other Ambulatory Visit: Payer: Medicare Other

## 2022-11-24 ENCOUNTER — Ambulatory Visit: Payer: Medicare Other | Admitting: Urology

## 2022-11-25 ENCOUNTER — Other Ambulatory Visit: Payer: Self-pay | Admitting: *Deleted

## 2022-11-25 DIAGNOSIS — R972 Elevated prostate specific antigen [PSA]: Secondary | ICD-10-CM

## 2022-11-26 ENCOUNTER — Other Ambulatory Visit: Payer: Medicare Other

## 2022-11-26 DIAGNOSIS — R972 Elevated prostate specific antigen [PSA]: Secondary | ICD-10-CM

## 2022-11-27 LAB — PSA: Prostate Specific Ag, Serum: 0.8 ng/mL (ref 0.0–4.0)

## 2022-12-01 ENCOUNTER — Ambulatory Visit (INDEPENDENT_AMBULATORY_CARE_PROVIDER_SITE_OTHER): Payer: Medicare Other | Admitting: Urology

## 2022-12-01 VITALS — BP 150/72 | HR 78 | Ht 64.0 in | Wt 121.2 lb

## 2022-12-01 DIAGNOSIS — N529 Male erectile dysfunction, unspecified: Secondary | ICD-10-CM | POA: Diagnosis not present

## 2022-12-01 DIAGNOSIS — C61 Malignant neoplasm of prostate: Secondary | ICD-10-CM

## 2022-12-01 DIAGNOSIS — N393 Stress incontinence (female) (male): Secondary | ICD-10-CM

## 2022-12-01 DIAGNOSIS — N401 Enlarged prostate with lower urinary tract symptoms: Secondary | ICD-10-CM

## 2022-12-01 DIAGNOSIS — N138 Other obstructive and reflux uropathy: Secondary | ICD-10-CM

## 2022-12-01 DIAGNOSIS — R339 Retention of urine, unspecified: Secondary | ICD-10-CM

## 2022-12-01 LAB — BLADDER SCAN AMB NON-IMAGING: Scan Result: 22

## 2022-12-01 MED ORDER — TADALAFIL 20 MG PO TABS: 20.0000 mg | ORAL_TABLET | Freq: Every day | ORAL | 3 refills | Status: DC | PRN

## 2022-12-01 NOTE — Progress Notes (Signed)
Michael Edwards,acting as a Education administrator for Michael Espy, MD.,have documented all relevant documentation on the behalf of Michael Espy, MD,as directed by  Michael Espy, MD while in the presence of Michael Espy, MD.  12/01/2022 10:47 AM   Michael Edwards 79/05/1946 [id number]  Referring provider: Perrin Maltese, MD [address] [place],  [place] [address]  Chief Complaint  Patient presents with   Benign Prostatic Hypertrophy    HPI: 77 year-old male who returned today for a routine annual follow-up.   He has a personal history of chronic BPH with outlet obstruction previously managed by [facility]. He is status post HoLEP and incidental prostate cancer. He underwent HoLEP in 03/2022.   Surgical pathology was consistent with BPH of 1% of incidental Gleason 3+2 prostate cancer. Pre-operative PSA was 9.9. His most recent PSA on 11/26/2022 remains stable at 0.8.  He has a personal history of erectile dysfunction and uses 20 mg of Cialis as needed.   Today, he reports occasional urinary leakage. He states this occurs primarily when he consumes soda.     IPSS     Row Name 12/01/22 0900         International Prostate Symptom Score   How often have you had the sensation of not emptying your bladder? Not at All     How often have you had to urinate less than every two hours? Not at All     How often have you found you stopped and started again several times when you urinated? Not at All     How often have you found it difficult to postpone urination? Not at All     How often have you had a weak urinary stream? Not at All     How often have you had to strain to start urination? Not at All     How many times did you typically get up at night to urinate? 2 Times     Total IPSS Score 2       Quality of Life due to urinary symptoms   If you were to spend the rest of your life with your urinary condition just the way it is now how would you feel about that? Mostly Satisfied               Score:  1-7 Mild 8-19 Moderate 20-35 Severe   Results for orders placed or performed in visit on 12/01/22  Bladder Scan (Post Void Residual) in office  Result Value Ref Range   Scan Result 22 ml     PMH: Past Medical History:  Diagnosis Date   CAD (coronary artery disease)    Cardiac murmur    ED (erectile dysfunction)    Elevated PSA    10.6 on 07/2015   Enlarged prostate    HLD (hyperlipidemia)    Incomplete bladder emptying 07/27/2016   S/P TAVR (transcatheter aortic valve replacement) 01/30/2019   23 mm Edwards Sapien 3 transcatheter heart valve placed via percutaneous right transfemoral approach    Self-catheterizes urinary bladder    Severe aortic stenosis 08/19/2015   Squamous cell carcinoma of skin 08/04/2020   Right thumb. SCCis, hypertrophic. shave removal and EDC 10/21/20   STEMI (ST elevation myocardial infarction) ([place])    "many years ago" in [place]; no LHC for PCI; treated medically per patient report   Tobacco abuse     Surgical History: Past Surgical History:  Procedure Laterality Date   ANKLE FRACTURE SURGERY Left  blood clot removed from left upper leg     HOLEP-LASER ENUCLEATION OF THE PROSTATE WITH MORCELLATION N/A 03/16/2021   Procedure: HOLEP-LASER ENUCLEATION OF THE PROSTATE WITH MORCELLATION;  Surgeon: Michael Espy, MD;  Location: [facility];  Service: Urology;  Laterality: N/A;   INGUINAL HERNIA REPAIR Left 02/03/2017   Procedure: HERNIA REPAIR INGUINAL ADULT;  Surgeon: Clayburn Pert, MD;  Location: [facility];  Service: General;  Laterality: Left;   RIGHT HEART CATH AND CORONARY ANGIOGRAPHY N/A 01/12/2019   Procedure: RIGHT HEART CATH AND CORONARY ANGIOGRAPHY;  Surgeon: Wellington Hampshire, MD;  Location: [place] [facility];  Service: Cardiovascular;  Laterality: N/A;   TEE WITHOUT CARDIOVERSION N/A 01/30/2019   Procedure: TRANSESOPHAGEAL ECHOCARDIOGRAM (TEE);  Surgeon: Burnell Blanks, MD;  Location: [place] [facility];  Service:  Open Heart Surgery;  Laterality: N/A;   TONSILLECTOMY     TRANSCATHETER AORTIC VALVE REPLACEMENT, TRANSFEMORAL N/A 01/30/2019   Procedure: TRANSCATHETER AORTIC VALVE REPLACEMENT, TRANSFEMORAL;  Surgeon: Burnell Blanks, MD;  Location: [place] [facility];  Service: Open Heart Surgery;  Laterality: N/A;   VASECTOMY      Home Medications:  Allergies as of 12/01/2022   No Known Allergies      Medication List        Accurate as of December 01, 2022 10:47 AM. If you have any questions, ask your nurse or doctor.          aspirin EC 81 MG tablet Take 81 mg by mouth daily.   multivitamin tablet Take 1 tablet by mouth daily.   OVER THE COUNTER MEDICATION Eye vitamin   tadalafil 20 MG tablet Commonly known as: CIALIS Take 1 tablet (20 mg total) by mouth daily as needed for erectile dysfunction.        Family History: Family History  Problem Relation Age of Onset   Hematuria Mother    Peripheral Artery Disease Father    Diabetes Sister    Heart failure Sister     Social History:  reports that he has been smoking cigarettes. He has a 58.00 pack-year smoking history. He has never used smokeless tobacco. He reports current alcohol use. He reports that he does not use drugs.   Physical Exam: BP (!) 150/72   Pulse 78   Ht '5\' 4"'$  (1.626 m)   Wt 121 lb 4 oz (55 kg)   BMI 20.81 kg/m   Constitutional:  Alert and oriented, No acute distress. GU: Normal sphincter tone, prostatomegaly with rubbery multi-nodule consistent with BPH. Neurologic: Grossly intact, no focal deficits, moving all 4 extremities. Psychiatric: Normal mood and affect.   Assessment & Plan:    Prostate cancer - Incidental - Low volume, PSA stable - Continue annual PSA and rectal exam.  2. BPH with history of urinary retention - Symptoms are extremely well controlled.   3. Erectile dysfunction - Continue using 20 mg of Cialis as needed.   4. Stress incontinence - Mild, desires no  intervention.  Return in about 1 year (around 12/02/2023) for PSA, DRE, IPSS, and PVR.  I have reviewed the above documentation for accuracy and completeness, and I agree with the above.   Michael Espy, MD   [facility] [address] [place], [place] [address] [phone]

## 2023-08-28 ENCOUNTER — Emergency Department
Admission: EM | Admit: 2023-08-28 | Discharge: 2023-08-28 | Disposition: A | Discharge status: Home / Self Care | Payer: Medicare Other | Attending: Emergency Medicine | Admitting: Emergency Medicine

## 2023-08-28 ENCOUNTER — Other Ambulatory Visit: Payer: Self-pay

## 2023-08-28 ENCOUNTER — Emergency Department: Payer: Medicare Other

## 2023-08-28 DIAGNOSIS — K529 Noninfective gastroenteritis and colitis, unspecified: Secondary | ICD-10-CM | POA: Insufficient documentation

## 2023-08-28 DIAGNOSIS — R252 Cramp and spasm: Secondary | ICD-10-CM | POA: Diagnosis not present

## 2023-08-28 DIAGNOSIS — I2510 Atherosclerotic heart disease of native coronary artery without angina pectoris: Secondary | ICD-10-CM | POA: Diagnosis not present

## 2023-08-28 DIAGNOSIS — R1084 Generalized abdominal pain: Secondary | ICD-10-CM

## 2023-08-28 DIAGNOSIS — R001 Bradycardia, unspecified: Secondary | ICD-10-CM | POA: Diagnosis not present

## 2023-08-28 DIAGNOSIS — R109 Unspecified abdominal pain: Secondary | ICD-10-CM | POA: Diagnosis not present

## 2023-08-28 DIAGNOSIS — R0902 Hypoxemia: Secondary | ICD-10-CM | POA: Diagnosis not present

## 2023-08-28 DIAGNOSIS — R197 Diarrhea, unspecified: Secondary | ICD-10-CM | POA: Diagnosis not present

## 2023-08-28 DIAGNOSIS — R9431 Abnormal electrocardiogram [ECG] [EKG]: Secondary | ICD-10-CM | POA: Diagnosis not present

## 2023-08-28 LAB — COMPREHENSIVE METABOLIC PANEL
ALT: 18 U/L (ref 0–44)
AST: 21 U/L (ref 15–41)
Albumin: 4.3 g/dL (ref 3.5–5.0)
Alkaline Phosphatase: 63 U/L (ref 38–126)
Anion gap: 8 (ref 5–15)
BUN: 24 mg/dL — ABNORMAL HIGH (ref 8–23)
CO2: 24 mmol/L (ref 22–32)
Calcium: 8.6 mg/dL — ABNORMAL LOW (ref 8.9–10.3)
Chloride: 106 mmol/L (ref 98–111)
Creatinine, Ser: 1.02 mg/dL (ref 0.61–1.24)
GFR, Estimated: >60 mL/min (ref >60)
Glucose, Bld: 109 mg/dL — ABNORMAL HIGH (ref 70–99)
Potassium: 4.4 mmol/L (ref 3.5–5.1)
Sodium: 138 mmol/L (ref 135–145)
Total Bilirubin: 0.7 mg/dL (ref 0.3–1.2)
Total Protein: 7 g/dL (ref 6.5–8.1)

## 2023-08-28 LAB — CBC WITH DIFFERENTIAL/PLATELET
Abs Immature Granulocytes: 0.03 10*3/uL (ref 0.00–0.07)
Basophils Absolute: 0.1 10*3/uL (ref 0.0–0.1)
Basophils Relative: 1 %
Eosinophils Absolute: 0.2 10*3/uL (ref 0.0–0.5)
Eosinophils Relative: 1 %
HCT: 42.9 % (ref 39.0–52.0)
Hemoglobin: 14.8 g/dL (ref 13.0–17.0)
Immature Granulocytes: 0 %
Lymphocytes Relative: 25 %
Lymphs Abs: 2.8 10*3/uL (ref 0.7–4.0)
MCH: 31.6 pg (ref 26.0–34.0)
MCHC: 34.5 g/dL (ref 30.0–36.0)
MCV: 91.7 fL (ref 80.0–100.0)
Monocytes Absolute: 0.8 10*3/uL (ref 0.1–1.0)
Monocytes Relative: 7 %
Neutro Abs: 7.2 10*3/uL (ref 1.7–7.7)
Neutrophils Relative %: 66 %
Platelets: 226 10*3/uL (ref 150–400)
RBC: 4.68 MIL/uL (ref 4.22–5.81)
RDW: 12.6 % (ref 11.5–15.5)
WBC: 11 10*3/uL — ABNORMAL HIGH (ref 4.0–10.5)
nRBC: 0 % (ref 0.0–0.2)

## 2023-08-28 LAB — URINALYSIS, W/ REFLEX TO CULTURE (INFECTION SUSPECTED)
Bacteria, UA: NONE SEEN
Bilirubin Urine: NEGATIVE
Glucose, UA: NEGATIVE mg/dL
Hgb urine dipstick: NEGATIVE
Ketones, ur: 5 mg/dL — AB
Leukocytes,Ua: NEGATIVE
Nitrite: NEGATIVE
Protein, ur: 30 mg/dL — AB
Specific Gravity, Urine: >1.046 — ABNORMAL HIGH (ref 1.005–1.030)
pH: 5 (ref 5.0–8.0)

## 2023-08-28 LAB — CBG MONITORING, ED: Glucose-Capillary: 99 mg/dL (ref 70–99)

## 2023-08-28 LAB — LIPASE, BLOOD: Lipase: 44 U/L (ref 11–51)

## 2023-08-28 MED ORDER — IOHEXOL 300 MG/ML  SOLN
80.0000 mL | Freq: Once | INTRAMUSCULAR | Status: AC | PRN
  Administered 2023-08-28: 80 mL via INTRAVENOUS

## 2023-08-28 MED ORDER — ONDANSETRON HCL 4 MG PO TABS: 4.0000 mg | ORAL_TABLET | Freq: Every day | ORAL | 1 refills | Status: DC | PRN

## 2023-08-28 MED ORDER — ACETAMINOPHEN 325 MG PO TABS
650.0000 mg | ORAL_TABLET | Freq: Once | ORAL | Status: AC
  Administered 2023-08-28: 650 mg via ORAL
  Filled 2023-08-28: qty 2

## 2023-08-28 MED ORDER — KETOROLAC TROMETHAMINE 15 MG/ML IJ SOLN
15.0000 mg | Freq: Once | INTRAMUSCULAR | Status: AC
  Administered 2023-08-28: 15 mg via INTRAVENOUS
  Filled 2023-08-28: qty 1

## 2023-08-28 NOTE — Discharge Instructions (Signed)
Drink plenty of fluids to stay well-hydrated.  Find Pedialyte or similar electrolyte rehydration formulas at your local pharmacy.  Thank you for choosing [place] for your health care today!  Please see your primary doctor this week for a follow up appointment.   If you have any new, worsening, or unexpected symptoms call your doctor right away or come back to the emergency department for reevaluation.  It was my pleasure to care for you today.   Daneil Dan Modesto Charon, MD

## 2023-08-28 NOTE — ED Triage Notes (Signed)
Pt arrives via EMS from home. Pt reports abdominal pain, diarrhea, and chills since 0300 this morning. Pt arrives AxOx4.
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
## 2023-09-01 DIAGNOSIS — K5289 Other specified noninfective gastroenteritis and colitis: Secondary | ICD-10-CM | POA: Diagnosis not present

## 2023-09-05 ENCOUNTER — Ambulatory Visit: Payer: Medicare Other | Admitting: Family

## 2023-09-05 ENCOUNTER — Encounter: Payer: Self-pay | Admitting: Family

## 2023-09-05 VITALS — BP 134/64 | HR 60 | Ht 65.0 in | Wt 119.8 lb

## 2023-09-05 DIAGNOSIS — K529 Noninfective gastroenteritis and colitis, unspecified: Secondary | ICD-10-CM | POA: Diagnosis not present

## 2023-09-05 DIAGNOSIS — R112 Nausea with vomiting, unspecified: Secondary | ICD-10-CM | POA: Diagnosis not present

## 2023-09-05 MED ORDER — AMOXICILLIN-POT CLAVULANATE 875-125 MG PO TABS: 1.0000 | ORAL_TABLET | Freq: Two times a day (BID) | ORAL | 0 refills | Status: DC

## 2023-09-05 NOTE — Progress Notes (Signed)
Established Patient Office Visit  Subjective:  Patient ID: Michael Edwards, male    DOB: Dec 26, 1945  Age: 77 y.o. MRN: [id number]  Chief Complaint  Patient presents with   Hospitalization Follow-up    Hospital follow up    Patient is here today after a recent visit to the ED.  He had a major bout of abdominal pain, nausea, vomiting, and diarrhea last week.  He went to the ED and to Urgent Care on separate days, and they both asked that he make an appointment with [place] to follow up this week.   He is feeling better overall, says that he is mostly back to normal.  Needs a note to go back to work.     No other concerns at this time.   Past Medical History:  Diagnosis Date   CAD (coronary artery disease)    Cardiac murmur    ED (erectile dysfunction)    Elevated PSA    10.6 on 07/2015   Enlarged prostate    HLD (hyperlipidemia)    Incomplete bladder emptying 07/27/2016   S/P TAVR (transcatheter aortic valve replacement) 01/30/2019   23 mm Edwards Sapien 3 transcatheter heart valve placed via percutaneous right transfemoral approach    Self-catheterizes urinary bladder    Severe aortic stenosis 08/19/2015   Squamous cell carcinoma of skin 08/04/2020   Right thumb. SCCis, hypertrophic. shave removal and EDC 10/21/20   STEMI (ST elevation myocardial infarction) (HCC)    "many years ago" in [place]; no LHC for PCI; treated medically per patient report   Tobacco abuse     Past Surgical History:  Procedure Laterality Date   ANKLE FRACTURE SURGERY Left    blood clot removed from left upper leg     HOLEP-LASER ENUCLEATION OF THE PROSTATE WITH MORCELLATION N/A 03/16/2021   Procedure: HOLEP-LASER ENUCLEATION OF THE PROSTATE WITH MORCELLATION;  Surgeon: [name], MD;  Location: [facility] ORS;  Service: Urology;  Laterality: N/A;   INGUINAL HERNIA REPAIR Left 02/03/2017   Procedure: HERNIA REPAIR INGUINAL ADULT;  Surgeon: Ricarda Frame, MD;  Location: [facility] ORS;  Service: General;   Laterality: Left;   RIGHT HEART CATH AND CORONARY ANGIOGRAPHY N/A 01/12/2019   Procedure: RIGHT HEART CATH AND CORONARY ANGIOGRAPHY;  Surgeon: Iran Ouch, MD;  Location: [facility] INVASIVE CV LAB;  Service: Cardiovascular;  Laterality: N/A;   TEE WITHOUT CARDIOVERSION N/A 01/30/2019   Procedure: TRANSESOPHAGEAL ECHOCARDIOGRAM (TEE);  Surgeon: Kathleene Hazel, MD;  Location: [facility] INVASIVE CV LAB;  Service: Open Heart Surgery;  Laterality: N/A;   TONSILLECTOMY     TRANSCATHETER AORTIC VALVE REPLACEMENT, TRANSFEMORAL N/A 01/30/2019   Procedure: TRANSCATHETER AORTIC VALVE REPLACEMENT, TRANSFEMORAL;  Surgeon: Kathleene Hazel, MD;  Location: [facility] INVASIVE CV LAB;  Service: Open Heart Surgery;  Laterality: N/A;   VASECTOMY      Social History   Socioeconomic History   Marital status: Married    Spouse name: Not on file   Number of children: 2   Years of education: Not on file   Highest education level: Not on file  Occupational History   Occupation: Janitorial services  Tobacco Use   Smoking status: Every Day    Current packs/day: 1.00    Average packs/day: 1 pack/day for 58.0 years (58.0 ttl pk-yrs)    Types: Cigarettes   Smokeless tobacco: Never  Vaping Use   Vaping status: Never Used  Substance and Sexual Activity   Alcohol use: Yes    Comment: 6 beers a  week   Drug use: No   Sexual activity: Yes  Other Topics Concern   Not on file  Social History Narrative   Not on file   Social Determinants of Health   Financial Resource Strain: Not on file  Food Insecurity: Not on file  Transportation Needs: Not on file  Physical Activity: Not on file  Stress: Not on file  Social Connections: Not on file  Intimate Partner Violence: Not on file    Family History  Problem Relation Age of Onset   Hematuria Mother    Peripheral Artery Disease Father    Diabetes Sister    Heart failure Sister     No Known Allergies  Review of Systems  Gastrointestinal:  Positive for  abdominal pain, diarrhea, nausea and vomiting.  All other systems reviewed and are negative.      Objective:   BP 134/64   Pulse 60   Ht 5\' 5"  (1.651 m)   Wt 119 lb 12.8 oz (54.3 kg)   SpO2 95%   BMI 19.94 kg/m   Vitals:   09/05/23 1401  BP: 134/64  Pulse: 60  Height: 5\' 5"  (1.651 m)  Weight: 119 lb 12.8 oz (54.3 kg)  SpO2: 95%  BMI (Calculated): 19.94    Physical Exam Vitals and nursing note reviewed.  Constitutional:      Appearance: Normal appearance. He is normal weight.  Eyes:     Pupils: Pupils are equal, round, and reactive to light.  Cardiovascular:     Rate and Rhythm: Normal rate and regular rhythm.     Pulses: Normal pulses.     Heart sounds: Normal heart sounds.  Pulmonary:     Effort: Pulmonary effort is normal.     Breath sounds: Normal breath sounds.  Neurological:     General: No focal deficit present.     Mental Status: He is alert and oriented to person, place, and time. Mental status is at baseline.  Psychiatric:        Mood and Affect: Mood normal.        Behavior: Behavior normal.        Thought Content: Thought content normal.        Judgment: Judgment normal.      No results found for any visits on 09/05/23.  Recent Results (from the past 2160 hour(s))  CBG monitoring, ED     Status: None   Collection Time: 08/28/23  9:29 AM  Result Value Ref Range   Glucose-Capillary 99 70 - 99 mg/dL    Comment: Glucose reference range applies only to samples taken after fasting for at least 8 hours.  Comprehensive metabolic panel     Status: Abnormal   Collection Time: 08/28/23  9:30 AM  Result Value Ref Range   Sodium 138 135 - 145 mmol/L   Potassium 4.4 3.5 - 5.1 mmol/L   Chloride 106 98 - 111 mmol/L   CO2 24 22 - 32 mmol/L   Glucose, Bld 109 (H) 70 - 99 mg/dL    Comment: Glucose reference range applies only to samples taken after fasting for at least 8 hours.   BUN 24 (H) 8 - 23 mg/dL   Creatinine, Ser 2.84 0.61 - 1.24 mg/dL   Calcium  8.6 (L) 8.9 - 10.3 mg/dL   Total Protein 7.0 6.5 - 8.1 g/dL   Albumin 4.3 3.5 - 5.0 g/dL   AST 21 15 - 41 U/L   ALT 18 0 - 44 U/L  Alkaline Phosphatase 63 38 - 126 U/L   Total Bilirubin 0.7 0.3 - 1.2 mg/dL   GFR, Estimated >13 >08 mL/min    Comment: (NOTE) Calculated using the CKD-EPI Creatinine Equation (2021)    Anion gap 8 5 - 15    Comment: Performed at [facility], [address] [place], [place] [address]  Lipase, blood     Status: None   Collection Time: 08/28/23  9:30 AM  Result Value Ref Range   Lipase 44 11 - 51 U/L    Comment: Performed at [facility], [address] [place], [place] [address]  CBC with Differential     Status: Abnormal   Collection Time: 08/28/23  9:30 AM  Result Value Ref Range   WBC 11.0 (H) 4.0 - 10.5 K/uL   RBC 4.68 4.22 - 5.81 MIL/uL   Hemoglobin 14.8 13.0 - 17.0 g/dL   HCT 52.8 41.3 - 24.4 %   MCV 91.7 80.0 - 100.0 fL   MCH 31.6 26.0 - 34.0 pg   MCHC 34.5 30.0 - 36.0 g/dL   RDW 01.0 27.2 - 53.6 %   Platelets 226 150 - 400 K/uL   nRBC 0.0 0.0 - 0.2 %   Neutrophils Relative % 66 %   Neutro Abs 7.2 1.7 - 7.7 K/uL   Lymphocytes Relative 25 %   Lymphs Abs 2.8 0.7 - 4.0 K/uL   Monocytes Relative 7 %   Monocytes Absolute 0.8 0.1 - 1.0 K/uL   Eosinophils Relative 1 %   Eosinophils Absolute 0.2 0.0 - 0.5 K/uL   Basophils Relative 1 %   Basophils Absolute 0.1 0.0 - 0.1 K/uL   Immature Granulocytes 0 %   Abs Immature Granulocytes 0.03 0.00 - 0.07 K/uL    Comment: Performed at [facility], [address] [place], [place] [address]  Urinalysis, w/ Reflex to Culture (Infection Suspected) -Urine, Clean Catch     Status: Abnormal   Collection Time: 08/28/23 11:18 AM  Result Value Ref Range   Specimen Source URINE, CLEAN CATCH    Color, Urine YELLOW (A) YELLOW   APPearance CLEAR (A) CLEAR   Specific Gravity, Urine >1.046 (H) 1.005 - 1.030   pH 5.0 5.0 - 8.0   Glucose, UA NEGATIVE NEGATIVE mg/dL   Hgb urine  dipstick NEGATIVE NEGATIVE   Bilirubin Urine NEGATIVE NEGATIVE   Ketones, ur 5 (A) NEGATIVE mg/dL   Protein, ur 30 (A) NEGATIVE mg/dL   Nitrite NEGATIVE NEGATIVE   Leukocytes,Ua NEGATIVE NEGATIVE   RBC / HPF 0-5 0 - 5 RBC/hpf   WBC, UA 0-5 0 - 5 WBC/hpf    Comment:        Reflex urine culture not performed if WBC <=10, OR if Squamous epithelial cells >5. If Squamous epithelial cells >5 suggest recollection.    Bacteria, UA NONE SEEN NONE SEEN   Squamous Epithelial / HPF 0-5 0 - 5 /HPF   Mucus PRESENT     Comment: Performed at [facility], [address] [place], [place] [address]       Assessment & Plan:   Problem List Items Addressed This Visit   None Visit Diagnoses     Nausea and vomiting, unspecified vomiting type    -  Primary   Colitis       sending patient antibiotics, he will let us know if he does not improve.       Return to be set by patient when he is more  certain of his schedule.   Total time spent: 20 minutes  Miki Kins, FNP  09/05/2023   This document may have been prepared by [facility] Voice Recognition software and as such may include unintentional dictation errors.

## 2023-11-25 ENCOUNTER — Other Ambulatory Visit: Payer: Medicare Other

## 2023-11-29 ENCOUNTER — Ambulatory Visit: Payer: Medicare Other | Admitting: Urology

## 2023-11-29 VITALS — BP 115/71 | HR 80 | Ht 65.0 in | Wt 119.0 lb

## 2023-11-29 DIAGNOSIS — C61 Malignant neoplasm of prostate: Secondary | ICD-10-CM

## 2023-11-29 DIAGNOSIS — N529 Male erectile dysfunction, unspecified: Secondary | ICD-10-CM | POA: Diagnosis not present

## 2023-11-29 DIAGNOSIS — Z87438 Personal history of other diseases of male genital organs: Secondary | ICD-10-CM

## 2023-11-29 DIAGNOSIS — N138 Other obstructive and reflux uropathy: Secondary | ICD-10-CM

## 2023-11-29 LAB — BLADDER SCAN AMB NON-IMAGING: Scan Result: 70

## 2023-11-29 MED ORDER — TADALAFIL 20 MG PO TABS: 20.0000 mg | ORAL_TABLET | Freq: Every day | ORAL | 3 refills | Status: DC | PRN

## 2023-11-29 NOTE — Progress Notes (Signed)
I,[name],acting as a scribe for Michael Scotland, MD.,have documented all relevant documentation on the behalf of Michael Scotland, MD,as directed by  Michael Scotland, MD while in the presence of Michael Scotland, MD.  11/29/2023 9:46 AM   Michael Edwards 04-18-1946 [id number]  Referring provider: Margaretann Loveless, MD [address] [place],  [place] [address]  Chief Complaint  Patient presents with   Benign Prostatic Hypertrophy   Prostate Cancer    HPI: 78 year-old male with a personal history of BPH, prostate cancer, and erectile dysfunction presents today for annual follow-up.   He has a personal history of chronic BPH with outlet obstruction previously managed by [facility]. He is status post HoLEP in 03/2022.    Surgical pathology was consistent with BPH of 1% of incidental Gleason 3+3 prostate cancer.   Pre-operative PSA was 9.9. His most recent PSA on 11/26/2022 remains stable at 0.8.   For his erectile dysfunction he is managed on 20 mg of Cialis as needed.  Other than messing up his back last week he is doing well.   He decided to stop taking the Cialis daily a week or so ago to see what would happen and he had increased urinary leakage, after urination,  since he stopped taking it. He thinks it also helps with his bowel movements.    IPSS     Row Name 11/29/23 0800         International Prostate Symptom Score   How often have you had the sensation of not emptying your bladder? Less than 1 in 5     How often have you had to urinate less than every two hours? Less than 1 in 5 times     How often have you found you stopped and started again several times when you urinated? Less than 1 in 5 times     How often have you found it difficult to postpone urination? Not at All     How often have you had a weak urinary stream? Less than 1 in 5 times     How often have you had to strain to start urination? Not at All     How many times did you typically get up at night to urinate? 2  Times     Total IPSS Score 6       Quality of Life due to urinary symptoms   If you were to spend the rest of your life with your urinary condition just the way it is now how would you feel about that? Pleased            Score:  1-7 Mild 8-19 Moderate 20-35 Severe Results for orders placed or performed in visit on 11/29/23  Bladder Scan (Post Void Residual) in office  Result Value Ref Range   Scan Result 70 ml     PMH: Past Medical History:  Diagnosis Date   CAD (coronary artery disease)    Cardiac murmur    ED (erectile dysfunction)    Elevated PSA    10.6 on 07/2015   Enlarged prostate    HLD (hyperlipidemia)    Incomplete bladder emptying 07/27/2016   S/P TAVR (transcatheter aortic valve replacement) 01/30/2019   23 mm Edwards Sapien 3 transcatheter heart valve placed via percutaneous right transfemoral approach    Self-catheterizes urinary bladder    Severe aortic stenosis 08/19/2015   Squamous cell carcinoma of skin 08/04/2020   Right thumb. SCCis, hypertrophic. shave removal and EDC  10/21/20   STEMI (ST elevation myocardial infarction) (HCC)    "many years ago" in [place]; no LHC for PCI; treated medically per patient report   Tobacco abuse     Surgical History: Past Surgical History:  Procedure Laterality Date   ANKLE FRACTURE SURGERY Left    blood clot removed from left upper leg     HOLEP-LASER ENUCLEATION OF THE PROSTATE WITH MORCELLATION N/A 03/16/2021   Procedure: HOLEP-LASER ENUCLEATION OF THE PROSTATE WITH MORCELLATION;  Surgeon: Michael Scotland, MD;  Location: [facility] ORS;  Service: Urology;  Laterality: N/A;   INGUINAL HERNIA REPAIR Left 02/03/2017   Procedure: HERNIA REPAIR INGUINAL ADULT;  Surgeon: Ricarda Frame, MD;  Location: [facility] ORS;  Service: General;  Laterality: Left;   RIGHT HEART CATH AND CORONARY ANGIOGRAPHY N/A 01/12/2019   Procedure: RIGHT HEART CATH AND CORONARY ANGIOGRAPHY;  Surgeon: Iran Ouch, MD;  Location: [facility] INVASIVE CV LAB;   Service: Cardiovascular;  Laterality: N/A;   TEE WITHOUT CARDIOVERSION N/A 01/30/2019   Procedure: TRANSESOPHAGEAL ECHOCARDIOGRAM (TEE);  Surgeon: Kathleene Hazel, MD;  Location[facility] INVASIVE CV LAB;  Service: Open Heart Surgery;  Laterality: N/A;   TONSILLECTOMY     TRANSCATHETER AORTIC VALVE REPLACEMENT, TRANSFEMORAL N/A 01/30/2019   Procedure: TRANSCATHETER AORTIC VALVE REPLACEMENT, TRANSFEMORAL;  Surgeon: Kathleene Hazel, MD;  Location: [facility] INVASIVE CV LAB;  Service: Open Heart Surgery;  Laterality: N/A;   VASECTOMY      Home Medications:  Allergies as of 11/29/2023   No Known Allergies      Medication List        Accurate as of November 29, 2023  9:46 AM. If you have any questions, ask your nurse or doctor.          STOP taking these medications    amoxicillin-clavulanate 875-125 MG tablet Commonly known as: AUGMENTIN   ondansetron 4 MG tablet Commonly known as: Zofran       TAKE these medications    aspirin EC 81 MG tablet Take 81 mg by mouth daily.   multivitamin tablet Take 1 tablet by mouth daily.   OVER THE COUNTER MEDICATION Eye vitamin   tadalafil 20 MG tablet Commonly known as: CIALIS Take 1 tablet (20 mg total) by mouth daily as needed for erectile dysfunction.        Family History: Family History  Problem Relation Age of Onset   Hematuria Mother    Peripheral Artery Disease Father    Diabetes Sister    Heart failure Sister     Social History:  reports that he has been smoking cigarettes. He has a 58 pack-year smoking history. He has never used smokeless tobacco. He reports current alcohol use. He reports that he does not use drugs.   Physical Exam: BP 115/71   Pulse 80   Ht 5\' 5"  (1.651 m)   Wt 119 lb (54 kg)   BMI 19.80 kg/m   Constitutional:  Alert and oriented, No acute distress. HEENT: [place] AT, moist mucus membranes.  Trachea midline, no masses. Neurologic: Grossly intact, no focal deficits, moving all 4  extremities. Psychiatric: Normal mood and affect.   Assessment & Plan:    1. BPH  - Status post HoLep. Not on any BPH medications.   - He recently discovered taking 20 mg of Cialis daily has been helping with his urinary symptoms as well. He denies experiencing any adverse side effects. Okay to continue the 20 mg dose daily and refills sent to [facility] pharmacy for the  year.  2. Prostate cancer  - Incidental, low risk.   - PSA drawn today and results still pending.  - Deferred rectal exam today based on his age.   3. Erectile dysfunction  - Continue using Cialis as needed.   Return in about 1 year (around 11/28/2024) for IPSS, PVR, PSA.  I have reviewed the above documentation for accuracy and completeness, and I agree with the above.   Michael Scotland, MD   [facility] [address], [address] [place], [place] [address] [phone]

## 2023-11-30 ENCOUNTER — Ambulatory Visit: Payer: Medicare Other | Admitting: Urology

## 2023-11-30 LAB — PSA: Prostate Specific Ag, Serum: 0.8 ng/mL (ref 0.0–4.0)

## 2024-11-12 ENCOUNTER — Other Ambulatory Visit: Payer: Self-pay

## 2024-11-12 DIAGNOSIS — C61 Malignant neoplasm of prostate: Secondary | ICD-10-CM

## 2024-11-26 ENCOUNTER — Other Ambulatory Visit: Payer: Self-pay

## 2024-11-26 DIAGNOSIS — C61 Malignant neoplasm of prostate: Secondary | ICD-10-CM | POA: Insufficient documentation

## 2024-11-26 DIAGNOSIS — N400 Enlarged prostate without lower urinary tract symptoms: Secondary | ICD-10-CM | POA: Insufficient documentation

## 2024-11-26 NOTE — Progress Notes (Unsigned)
" ° °  11/29/2024 9:31 AM   Dempsey ORN Sueanne February 26, 1946 [id number]  Reason for visit: Follow up BPH/ prostate Ca   HPI: 79 y.o. male, initial follow up with me today, previously seen by Dr. 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  Laboratory Data: Component Ref Range & Units (hover) 12 mo ago (11/29/23) 2 yr ago (11/26/22) 2 yr ago (05/24/22) 3 yr ago (11/20/21) 3 yr ago (01/08/21) 4 yr ago (01/15/20) 5 yr ago (01/04/19)  Prostate Specific Ag, Serum 0.8 0.8 CM 0.8 CM 0.8 CM 9.9 High  CM 9.5 High  CM 9.1 High      Pertinent Imaging: N/A    Assessment & Plan:    Benign prostatic hyperplasia, unspecified whether lower urinary tract symptoms present Assessment & Plan: Hx of BPH  - s/p HoLEP in 2022  - required [facility] prior to surgery  PVR 119cc today IPSS 3/2   Overall doing very well, no complaints Continue expectant management.  He is very happy since surgery RTC in 1 year for symptom check  Orders: -     BLADDER SCAN AMB NON-IMAGING  Prostate cancer (HCC) Assessment & Plan: Low-risk cT1a GG1 disease  - via HoLEP chips in 2022  - PSA ~10 pre-op, down to ~0.8 post-op  Last PSA 0.8 (Jan 2025)   - due for annual PSA surveillance   Orders: -     PSA; Future  ED (erectile dysfunction) of organic origin Assessment & Plan: Refilling 20 mg Cialis  today Working well, no side effects   Other orders -     Tadalafil ; Take 1 tablet (20 mg  total) by mouth daily as needed for erectile dysfunction.  Dispense: 90 tablet; Refill: 3       Penne JONELLE Skye, MD  [facility] [address] [place], [place] [address] [phone] "

## 2024-11-26 NOTE — Assessment & Plan Note (Signed)
 Hx of BPH  - s/p HoLEP in 2022  - required CIC prior to surgery  PVR 119cc today IPSS 3/2   Overall doing very well, no complaints Continue expectant management.  He is very happy since surgery RTC in 1 year for symptom check

## 2024-11-26 NOTE — Assessment & Plan Note (Signed)
 Low-risk cT1a GG1 disease  - via HoLEP chips in 2022  - PSA ~10 pre-op, down to ~0.8 post-op  Last PSA 0.8 (Jan 2025)   - due for annual PSA surveillance

## 2024-11-28 ENCOUNTER — Other Ambulatory Visit

## 2024-11-28 ENCOUNTER — Ambulatory Visit: Payer: Self-pay | Admitting: Urology

## 2024-11-28 DIAGNOSIS — C61 Malignant neoplasm of prostate: Secondary | ICD-10-CM

## 2024-11-29 ENCOUNTER — Ambulatory Visit: Admitting: Urology

## 2024-11-29 VITALS — BP 126/60 | HR 58 | Ht 65.0 in | Wt 113.6 lb

## 2024-11-29 DIAGNOSIS — N400 Enlarged prostate without lower urinary tract symptoms: Secondary | ICD-10-CM | POA: Diagnosis not present

## 2024-11-29 DIAGNOSIS — N529 Male erectile dysfunction, unspecified: Secondary | ICD-10-CM | POA: Insufficient documentation

## 2024-11-29 DIAGNOSIS — C61 Malignant neoplasm of prostate: Secondary | ICD-10-CM

## 2024-11-29 LAB — BLADDER SCAN AMB NON-IMAGING: Scan Result: 119

## 2024-11-29 LAB — PSA: Prostate Specific Ag, Serum: 0.9 ng/mL (ref 0.0–4.0)

## 2024-11-29 MED ORDER — TADALAFIL 20 MG PO TABS: 20.0000 mg | ORAL_TABLET | Freq: Every day | ORAL | 3 refills | Status: AC | PRN

## 2024-11-29 NOTE — Assessment & Plan Note (Signed)
 Refilling 20 mg Cialis  today Working well, no side effects

## 2025-11-25 ENCOUNTER — Other Ambulatory Visit

## 2025-11-29 ENCOUNTER — Ambulatory Visit: Admitting: Urology
